# Patient Record
Sex: Female | Born: 1980 | Hispanic: Yes | Smoking: Never smoker
Health system: Southern US, Community
[De-identification: ages and names within clinical notes are randomized; demographics above are authoritative.]

## PROBLEM LIST (undated history)

## (undated) ENCOUNTER — Inpatient Hospital Stay (HOSPITAL_COMMUNITY): Payer: Self-pay

## (undated) DIAGNOSIS — R51 Headache: Secondary | ICD-10-CM

## (undated) DIAGNOSIS — L98499 Non-pressure chronic ulcer of skin of other sites with unspecified severity: Secondary | ICD-10-CM

## (undated) DIAGNOSIS — G709 Myoneural disorder, unspecified: Secondary | ICD-10-CM

## (undated) DIAGNOSIS — M545 Low back pain, unspecified: Secondary | ICD-10-CM

## (undated) DIAGNOSIS — G510 Bell's palsy: Secondary | ICD-10-CM

## (undated) HISTORY — DX: Non-pressure chronic ulcer of skin of other sites with unspecified severity: L98.499

## (undated) HISTORY — DX: Low back pain: M54.5

## (undated) HISTORY — DX: Bell's palsy: G51.0

## (undated) HISTORY — DX: Low back pain, unspecified: M54.50

## (undated) HISTORY — DX: Morbid (severe) obesity due to excess calories: E66.01

## (undated) HISTORY — DX: Myoneural disorder, unspecified: G70.9

---

## 2010-05-31 ENCOUNTER — Emergency Department (HOSPITAL_COMMUNITY)
Admission: EM | Admit: 2010-05-31 | Discharge: 2010-05-31 | Payer: Self-pay | Source: Home / Self Care | Admitting: Emergency Medicine

## 2010-09-08 LAB — POCT PREGNANCY, URINE: Preg Test, Ur: NEGATIVE

## 2010-09-08 LAB — URINALYSIS, ROUTINE W REFLEX MICROSCOPIC
Hgb urine dipstick: NEGATIVE
Nitrite: NEGATIVE
Protein, ur: NEGATIVE mg/dL

## 2010-09-08 LAB — URINE MICROSCOPIC-ADD ON

## 2012-10-25 ENCOUNTER — Ambulatory Visit (INDEPENDENT_AMBULATORY_CARE_PROVIDER_SITE_OTHER): Payer: Self-pay | Admitting: *Deleted

## 2012-10-25 ENCOUNTER — Encounter: Payer: Self-pay | Admitting: *Deleted

## 2012-10-25 VITALS — BP 98/67 | Ht <= 58 in | Wt 179.0 lb

## 2012-10-25 DIAGNOSIS — Z3481 Encounter for supervision of other normal pregnancy, first trimester: Secondary | ICD-10-CM

## 2012-10-25 DIAGNOSIS — Z3480 Encounter for supervision of other normal pregnancy, unspecified trimester: Secondary | ICD-10-CM

## 2012-10-25 NOTE — Progress Notes (Signed)
Patient is here for new ob today, blood drawn and interpreter present.  She is doing well and will follow up in the next two weeks to see the physician.  Her periods are usually normal and she is pretty sure her last cycle was February 28th.

## 2012-10-26 LAB — OBSTETRIC PANEL
Antibody Screen: NEGATIVE
HCT: 38 % (ref 36.0–46.0)
Hepatitis B Surface Ag: NEGATIVE
Lymphocytes Relative: 18 % (ref 12–46)
Lymphs Abs: 1.9 10*3/uL (ref 0.7–4.0)
MCHC: 34.2 g/dL (ref 30.0–36.0)
MCV: 84.4 fL (ref 78.0–100.0)
Monocytes Absolute: 0.8 10*3/uL (ref 0.1–1.0)
Neutro Abs: 8.1 10*3/uL — ABNORMAL HIGH (ref 1.7–7.7)
Platelets: 249 10*3/uL (ref 150–400)
RBC: 4.5 MIL/uL (ref 3.87–5.11)
RDW: 14.2 % (ref 11.5–15.5)
Rh Type: POSITIVE
WBC: 10.9 10*3/uL — ABNORMAL HIGH (ref 4.0–10.5)

## 2012-10-30 ENCOUNTER — Encounter: Payer: Self-pay | Admitting: Obstetrics & Gynecology

## 2012-10-30 ENCOUNTER — Ambulatory Visit (INDEPENDENT_AMBULATORY_CARE_PROVIDER_SITE_OTHER): Payer: Self-pay | Admitting: Obstetrics & Gynecology

## 2012-10-30 VITALS — BP 95/68 | Wt 178.0 lb

## 2012-10-30 DIAGNOSIS — Z3480 Encounter for supervision of other normal pregnancy, unspecified trimester: Secondary | ICD-10-CM | POA: Insufficient documentation

## 2012-10-30 DIAGNOSIS — Z113 Encounter for screening for infections with a predominantly sexual mode of transmission: Secondary | ICD-10-CM

## 2012-10-30 DIAGNOSIS — Z1151 Encounter for screening for human papillomavirus (HPV): Secondary | ICD-10-CM

## 2012-10-30 DIAGNOSIS — O34219 Maternal care for unspecified type scar from previous cesarean delivery: Secondary | ICD-10-CM

## 2012-10-30 DIAGNOSIS — Z124 Encounter for screening for malignant neoplasm of cervix: Secondary | ICD-10-CM

## 2012-10-30 NOTE — Progress Notes (Signed)
Having some nausea, no vomiting and is having headache.

## 2012-10-30 NOTE — Progress Notes (Signed)
   Subjective:    Ana Goodwin is a [id number] [redacted]w[redacted]d being seen today for her first obstetrical visit.  Her obstetrical history is significant for 2 prior cesarean sections. Patient does intend to breast feed. Pregnancy history fully reviewed.  Patient reports nausea.  Filed Vitals:   10/30/12 1327  BP: 95/68  Weight: 178 lb (80.74 kg)    HISTORY: OB History   Grav Para Term Preterm Abortions TAB SAB Ect Mult Living   3 2 2  0 0 0 0 0 0 2     # Outc Date GA Lbr Len/2nd Wgt Sex Del Anes PTL Lv   1 TRM 2000 [redacted]w[redacted]d  6lb11oz(3.033kg) M CCS  No Yes   Comments: failure to progress  Narrow pelvis   2 TRM 2008 [redacted]w[redacted]d   M LTCS  No Yes   3 CUR              Past Medical History  Diagnosis Date  . 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 Pain in lower back     2 yrs ago/seen at hospital/lasted one months to be able to stand alone and walk  . Neuromuscular disorder    History reviewed. No pertinent past surgical history. Family History  Problem Relation Age of Onset  . Diabetes Mother   . Cancer Father     unsure of type     Exam    Uterus:     Pelvic Exam:    Perineum: No Hemorrhoids   Vulva: normal   Vagina:  normal mucosa   pH:    Cervix: anteverted   Adnexa: normal adnexa   Bony Pelvis: android  System: Breast:  normal appearance, no masses or tenderness   Skin: normal coloration and turgor, no rashes    Neurologic: oriented   Extremities: normal strength, tone, and muscle mass   HEENT PERRLA   Mouth/Teeth mucous membranes moist, pharynx normal without lesions   Neck supple   Cardiovascular: regular rate and rhythm   Respiratory:  appears well, vitals normal, no respiratory distress, acyanotic, normal RR, ear and throat exam is normal, neck free of mass or lymphadenopathy, chest clear, no wheezing, crepitations, rhonchi, normal symmetric air entry   Abdomen: soft, non-tender; bowel sounds normal; no masses,  no organomegaly   Urinary: urethral  meatus normal      Assessment:    Pregnancy: [id number] Patient Active Problem List   Diagnosis Date Noted  . Supervision of other normal pregnancy 10/30/2012  . Previous cesarean delivery, antepartum condition or complication 10/30/2012        Plan:     Initial labs drawn. Prenatal vitamins. Problem list reviewed and updated. Genetic Screening discussed First Screen: declined.  Ultrasound discussed; fetal survey: requested.  Follow up in 4 weeks.    Ana Griggs C. 10/30/2012

## 2012-11-28 ENCOUNTER — Ambulatory Visit (INDEPENDENT_AMBULATORY_CARE_PROVIDER_SITE_OTHER): Payer: Self-pay | Admitting: Family Medicine

## 2012-11-28 ENCOUNTER — Encounter: Payer: Self-pay | Admitting: Family Medicine

## 2012-11-28 VITALS — BP 103/67 | Wt 178.0 lb

## 2012-11-28 DIAGNOSIS — Z3480 Encounter for supervision of other normal pregnancy, unspecified trimester: Secondary | ICD-10-CM

## 2012-11-28 DIAGNOSIS — O34219 Maternal care for unspecified type scar from previous cesarean delivery: Secondary | ICD-10-CM

## 2012-11-28 DIAGNOSIS — Z3482 Encounter for supervision of other normal pregnancy, second trimester: Secondary | ICD-10-CM

## 2012-11-28 NOTE — Patient Instructions (Signed)
Embarazo - Segundo trimestre (Pregnancy - Second Trimester) El segundo trimestre del embarazo (del 3 al 6 mes) es un perodo de evolucin rpida para usted y el beb. Hacia el final del sexto mes, el beb mide aproximadamente 23 cm y pesa 680 g. Comenzar a sentir los movimientos del beb entre las 18 y las 20 semanas de embarazo. Podr sentir las pataditas ("quickening en ingls"). Hay un rpido aumento de peso. Puede segregar un lquido claro (calostro) de las mamas. Quizs sienta pequeas contracciones en el vientre (tero) Esto se conoce como falso trabajo de parto o contracciones de Braxton-Hicks. Es como una prctica del trabajo de parto que se produce cuando el beb est listo para salir. Generalmente los problemas de vmitos matinales ya se han superado hacia el final del primer trimestre. Algunas mujeres desarrollan pequeas manchas oscuras (que se denominan cloasma, mscara del embarazo) en la cara que normalmente se van luego del nacimiento del beb. La exposicin al sol empeora las manchas. Puede desarrollarse acn en algunas mujeres embarazadas, y puede desaparecer en aquellas que ya tienen acn. EXAMENES PRENATALES  Durante los exmenes prenatales, deber seguir realizando pruebas de sangre, segn avance el embarazo. Estas pruebas se realizan para controlar su salud y la del beb. Tambin se realizan anlisis de sangre para conocer los niveles de hemoglobina. La anemia (bajo nivel de hemoglobina) es frecuente durante el embarazo. Para prevenirla, se administran hierro y vitaminas. Tambin se le realizarn exmenes para saber si tiene diabetes entre las 24 y las 28 semanas del embarazo. Podrn repetirle algunas de las pruebas que le hicieron previamente.  En cada visita le medirn el tamao del tero. Esto se realiza para asegurarse de que el beb est creciendo correctamente de acuerdo al estado del embarazo.  Tambin en cada visita prenatal controlarn su presin arterial. Esto se realiza  para asegurarse de que no tenga toxemia.  Se controlar su orina para asegurarse de que no tenga infecciones, diabetes o protena en la orina.  Se controlar su peso regularmente para asegurarse que el aumento ocurre al ritmo indicado. Esto se hace para asegurarse que usted y el beb tienen una evolucin normal.  En algunas ocasiones se realiza una prueba de ultrasonido para confirmar el correcto desarrollo y evolucin del beb. Esta prueba se realiza con ondas sonoras inofensivas para el beb, de modo que el profesional pueda calcular ms precisamente la fecha del parto. Algunas veces se realizan pruebas especializadas del lquido amnitico que rodea al beb. Esta prueba se denomina amniocentesis. El lquido amnitico se obtiene introduciendo una aguja en el vientre (abdomen). Se realiza para controlar los cromosomas en aquellos casos en los que existe alguna preocupacin acerca de algn problema gentico que pueda sufrir el beb. En ocasiones se lleva a cabo cerca del final del embarazo, si es necesario inducir al parto. En este caso se realiza para asegurarse que los pulmones del beb estn lo suficientemente maduros como para que pueda vivir fuera del tero. CAMBIOS QUE OCURREN EN EL SEGUNDO TRIMESTRE DEL EMBARAZO Su organismo atravesar numerosos cambios durante el embarazo. Estos pueden variar de una persona a otra. Converse con el profesional que la asiste acerca los cambios que usted note y que la preocupen.  Durante el segundo trimestre probablemente sienta un aumento del apetito. Es normal tener "antojos" de ciertas comidas. Esto vara de una persona a otra y de un embarazo a otro.  El abdomen inferior comenzar a abultarse.  Podr tener la necesidad de orinar con ms frecuencia debido a   que el tero y el beb presionan sobre la vejiga. Tambin es frecuente contraer ms infecciones urinarias durante el embarazo. Puede evitarlas bebiendo gran cantidad de lquidos y vaciando la vejiga antes y  despus de mantener relaciones sexuales.  Podrn aparecer las primeras estras en las caderas, abdomen y mamas. Estos son cambios normales del cuerpo durante el embarazo. No existen medicamentos ni ejercicios que puedan prevenir estos cambios.  Es posible que comience a desarrollar venas inflamadas y abultadas (varices) en las piernas. El uso de medias de descanso, elevar sus pies durante 15 minutos, 3 a 4 veces al da y limitar la sal en su dieta ayuda a aliviar el problema.  Podr sentir acidez gstrica a medida que el tero crece y presiona contra el estmago. Puede tomar anticidos, con la autorizacin de su mdico, para aliviar este problema. Tambin es til ingerir pequeas comidas 4 a 5 veces al da.  La constipacin puede tratarse con un laxante o agregando fibra a su dieta. Beber grandes cantidades de lquidos, comer vegetales, frutas y granos integrales es de gran ayuda.  Tambin es beneficioso practicar actividad fsica. Si ha sido una persona activa hasta el embarazo, podr continuar con la mayora de las actividades durante el mismo. Si ha sido menos activa, puede ser beneficioso que comience con un programa de ejercicios, como realizar caminatas.  Puede desarrollar hemorroides hacia el final del segundo trimestre. Tomar baos de asiento tibios y utilizar cremas recomendadas por el profesional que lo asiste sern de ayuda para los problemas de hemorroides.  Tambin podr sentir dolor de espalda durante este momento de su embarazo. Evite levantar objetos pesados, utilice zapatos de taco bajo y mantenga una buena postura para ayudar a reducir los problemas de espalda.  Algunas mujeres embarazadas desarrollan hormigueo y adormecimiento de la mano y los dedos debido a la hinchazn y compresin de los ligamentos de la mueca (sndrome del tnel carpiano). Esto desaparece una vez que el beb nace.  Como sus pechos se agrandan, necesitar un sujetador ms grande. Use un sostn de soporte,  cmodo y de algodn. No utilice un sostn para amamantar hasta el ltimo mes de embarazo si va a amamantar al beb.  Podr observar una lnea oscura desde el ombligo hacia la zona pbica denominada linea nigra.  Podr observar que sus mejillas se ponen coloradas debido al aumento de flujo sanguneo en la cara.  Podr desarrollar "araitas" en la cara, cuello y pecho. Esto desaparece una vez que el beb nace. INSTRUCCIONES PARA EL CUIDADO DOMICILIARIO  Es extremadamente importante que evite el cigarrillo, hierbas medicinales, alcohol y las drogas no prescriptas durante el embarazo. Estas sustancias qumicas afectan la formacin y el desarrollo del beb. Evite estas sustancias durante todo el embarazo para asegurar el nacimiento de un beb sano.  La mayor parte de los cuidados que se aconsejan son los mismos que los indicados para el primer trimestre del embarazo. Cumpla con las citas tal como se le indic. Siga las instrucciones del profesional que lo asiste con respecto al uso de los medicamentos, el ejercicio y la dieta.  Durante el embarazo debe obtener nutrientes para usted y para su beb. Consuma alimentos balanceados a intervalos regulares. Elija alimentos como carne, pescado, leche y otros productos lcteos descremados, vegetales, frutas, panes integrales y cereales. El profesional le informar cul es el aumento de peso ideal.  Las relaciones sexuales fsicas pueden continuarse hasta cerca del fin del embarazo si no existen otros problemas. Estos problemas pueden ser la prdida temprana (  prematura) de lquido amnitico de las membranas, sangrado vaginal, dolor abdominal u otros problemas mdicos o del embarazo.  Realice actividad fsica todos los das, si no tiene restricciones. Consulte con el profesional que la asiste si no sabe con certeza si determinados ejercicios son seguros. El mayor aumento de peso tiene lugar durante los ltimos 2 trimestres del embarazo. El ejercicio la ayudar  a:  Controlar su peso.  Ponerla en forma para el parto.  Ayudarla a perder peso luego de haber dado a luz.  Use un buen sostn o como los que se usan para hacer deportes para aliviar la sensibilidad de las mamas. Tambin puede serle til si lo usa mientras duerme. Si pierde calostro, podr utilizar apsitos en el sostn.  No utilice la baera con agua caliente, baos turcos y saunas durante el embarazo.  Utilice el cinturn de seguridad sin excepcin cuando conduzca. Este la proteger a usted y al beb en caso de accidente.  Evite comer carne cruda, queso crudo, y el contacto con los utensilios y desperdicios de los gatos. Estos elementos contienen grmenes que pueden causar defectos de nacimiento en el beb.  El segundo trimestre es un buen momento para visitar a su dentista y evaluar su salud dental si an no lo ha hecho. Es importante mantener los dientes limpios. Utilice un cepillo de dientes blando. Cepllese ms suavemente durante el embarazo.  Es ms fcil perder algo de orina durante el embarazo. Apretar y fortalecer los msculos de la pelvis la ayudar con este problema. Practique detener la miccin cuando est en el bao. Estos son los mismos msculos que necesita fortalecer. Son tambin los mismos msculos que utiliza cuando trata de evitar los gases. Puede practicar apretando estos msculos 10 veces, y repetir esto 3 veces por da aproximadamente. Una vez que conozca qu msculos debe apretar, no realice estos ejercicios durante la miccin. Puede favorecerle una infeccin si la orina vuelve hacia atrs.  Pida ayuda si tiene necesidades econmicas, de asesoramiento o nutricionales durante el embarazo. El profesional podr ayudarla con respecto a estas necesidades, o derivarla a otros especialistas.  La piel puede ponerse grasa. Si esto sucede, lvese la cara con un jabn suave, utilice un humectante no graso y maquillaje con base de aceite o crema. CONSUMO DE MEDICAMENTOS Y DROGAS  DURANTE EL EMBARAZO  Contine tomando las vitaminas apropiadas para esta etapa tal como se le indic. Las vitaminas deben contener un miligramo de cido flico y deben suplementarse con hierro. Guarde todas las vitaminas fuera del alcance de los nios. La ingestin de slo un par de vitaminas o tabletas que contengan hierro puede ocasionar la muerte en un beb o en un nio pequeo.  Evite el uso de medicamentos, inclusive los de venta libre y hierbas que no hayan sido prescriptos o indicados por el profesional que la asiste. Algunos medicamentos pueden causar problemas fsicos al beb. Utilice los medicamentos de venta libre o de prescripcin para el dolor, el malestar o la fiebre, segn se lo indique el profesional que lo asiste. No utilice aspirina.  El consumo de alcohol est relacionado con ciertos defectos de nacimiento. Esto incluye el sndrome de alcoholismo fetal. Debe evitar el consumo de alcohol en cualquiera de sus formas. El cigarrillo causa nacimientos prematuros y bebs de bajo peso. El uso de drogas recreativas est absolutamente prohibido. Son muy nocivas para el beb. Un beb que nace de una madre adicta, ser adicto al nacer. Ese beb tendr los mismos sntomas de abstinencia que un adulto.    Infrmele al profesional si consume alguna droga.  No consuma drogas ilegales. Pueden causarle mucho dao al beb. SOLICITE ATENCIN MDICA SI: Tiene preguntas o preocupaciones durante su embarazo. Es mejor que llame para consultar las dudas que esperar hasta su prxima visita prenatal. De esta forma se sentir ms tranquila.  SOLICITE ATENCIN MDICA DE INMEDIATO SI:  La temperatura oral se eleva sin motivo por encima de 102 F (38.9 C) o segn le indique el profesional que lo asiste.  Tiene una prdida de lquido por la vagina (canal de parto). Si sospecha una ruptura de las membranas, tmese la temperatura y llame al profesional para informarlo sobre esto.  Observa unas pequeas manchas,  una hemorragia vaginal o elimina cogulos. Notifique al profesional acerca de la cantidad y de cuntos apsitos est utilizando. Unas pequeas manchas de sangre son algo comn durante el embarazo, especialmente despus de mantener relaciones sexuales.  Presenta un olor desagradable en la secrecin vaginal y observa un cambio en el color, de transparente a blanco.  Contina con las nuseas y no obtiene alivio de los remedios indicados. Vomita sangre o algo similar a la borra del caf.  [place] o sube ms de 900 g. en una semana, o segn lo indicado por el profesional que la asiste.  Observa que se le hinchan el rostro, las manos, los pies o las piernas.  Ha estado expuesta a la rubola y no ha sufrido la enfermedad.  Ha estado expuesta a la quinta enfermedad o a la varicela.  Presenta dolor abdominal. Las molestias en el ligamento redondo son una causa no cancerosa (benigna) frecuente de dolor abdominal durante el embarazo. El profesional que la asiste deber evaluarla.  Presenta dolor de cabeza intenso que no se alivia.  Presenta fiebre, diarrea, dolor al orinar o le falta la respiracin.  Presenta dificultad para ver, visin borrosa, o visin doble.  Sufre una cada, un accidente de trnsito o cualquier tipo de trauma.  Vive en un hogar en el que existe violencia fsica o mental. Document Released: 03/24/2005 Document Revised: 03/08/2012 [employer] Patient Information 2014 [employer].  Lactancia materna  (Breastfeeding)  El cambio hormonal durante el embarazo produce el desarrollo del tejido mamario y un aumento en el nmero y tamao de los conductos galactforos. La hormona prolactina permite que las protenas, los azcares y las grasas de la sangre produzcan la leche materna en las glndulas productoras de leche. La hormona progesterona impide que la leche materna sea liberada antes del nacimiento del beb. Despus del nacimiento del beb, su nivel de progesterona disminuye  permitiendo que la leche materna sea liberada. Pensar en el beb, as como la succin o el llanto, pueden estimular la liberacin de leche de las glndulas productoras de leche.  La decisin de amamantar (lactar) es una de las mejores opciones que usted puede hacer para usted y su beb. La informacin que sigue da una breve resea de los beneficios, as como otras caractersticas importantes que debe saber sobre la lactancia materna.  LOS BENEFICIOS DE AMAMANTAR  Para el beb   La primera leche (calostro) ayuda al mejor funcionamiento del sistema digestivo del beb.   La leche tiene anticuerpos que provienen de la madre y que ayudan a prevenir las infecciones en el beb.   El beb tiene una menor incidencia de asma, alergias y del sndrome de muerte sbita del lactante (SMSL).   Los nutrientes de la leche materna son mejores para el beb que la leche maternizada.  La leche materna mejora   el desarrollo cerebral del beb.   Su beb tendr menos gases, clicos y estreimiento.  Es menos probable que el beb desarrolle otras enfermedades, como obesidad infantil, asma o diabetes mellitus. Para usted   La lactancia materna favorece el desarrollo de un vnculo muy especial entre la madre y el beb.   Es ms conveniente, siempre disponible, a la temperatura adecuada y econmica.   La lactancia materna ayuda a quemar caloras y a perder el peso ganado durante el embarazo.   Hace que el tero se contraiga ms rpidamente a su tamao normal y disminuye el sangrado despus del parto.   Las madres que amamantan tienen menos riesgo de desarrollar osteoporosis o cncer de mama o de ovario en el futuro.  FRECUENCIA DEL AMAMANTAMIENTO   Un beb sano, nacido a trmino, puede amamantarse con tanta frecuencia como cada hora, o espaciar las comidas cada tres horas. La frecuencia en la lactancia varan de un beb a otro.   Los recin nacidos deben ser alimentados por lo menos cada 2-3 horas  durante el da y cada 4-5 horas durante la noche. Usted debe amamantarlo un mnimo de 8 tomas en un perodo de 24 horas.  Despierte al beb para amamantarlo si han pasado 3-4 horas desde la ltima comida.  Amamante cuando sienta la necesidad de reducir la plenitud de sus senos o cuando el beb muestre signos de hambre. Las seales de que el beb puede tener hambre son:  Aumenta su estado de alerta o vigilancia.  Se estira.  Mueve la cabeza de un lado a otro.  Mueve la cabeza y abre la boca cuando se le toca la mejilla o la boca (reflejo de succin).  Aumenta las vocalizaciones, tales como sonidos de succin, relamerse los labios, arrullos, suspiros, o chirridos.  Mueve la mano hacia la boca.  Se chupa con ganas los dedos o las manos.  Agitacin.  Llanto intermitente.  Los signos de hambre extrema requerirn que lo calme y lo consuele antes de tratar de alimentarlo. Los signos de hambre extrema son:  Agitacin.  Llanto fuerte e intenso.  Gritos.  El amamantamiento frecuente la ayudar a producir ms leche y a prevenir problemas de dolor en los pezones e hinchazn de las mamas.  LACTANCIA MATERNA   Ya sea que se encuentre acostada o sentada, asegrese que el abdomen del beb est enfrente el suyo.   Sostenga la mama con el pulgar por arriba y los otros 4 dedos por debajo del pezn. Asegrese que sus dedos se encuentren lejos del pezn y de la boca del beb.   Empuje suavemente los labios del beb con el pezn o con el dedo.   Cuando la boca del beb se abra lo suficiente, introduzca el pezn y la zona oscura que lo rodea (areola) tanto como le sea posible dentro de la boca.  Debe haber ms areola visible por arriba del labio superior que por debajo del labio inferior.  La lengua del beb debe estar entre la enca inferior y el seno.  Asegrese de que la boca del beb est en la posicin correcta alrededor del pezn (prendida). Los labios del beb deben crear un sello  sobre su pecho.  Las seales de que el beb se ha prendido eficazmente al pezn son:  Tironea o succiona sin dolor.  Se escucha que traga entre las succiones.  No hace ruidos ni chasquidos.  Hay movimientos musculares por arriba y por delante de sus odos al succionar.  El beb debe   succionar unos 2-3 minutos para que salga la leche. Permita que el nio se alimente en cada mama todo lo que desee. Alimente al beb hasta que se desprenda o se quede dormido en el primer pecho y luego ofrzcale el segundo pecho.  Las seales de que el beb est lleno y satisfecho son:  Disminuye gradualmente el nmero de succiones o no succiona.  Se queda dormido.  Extiende o relaja su cuerpo.  Retiene una pequea cantidad de leche en la boca.  Se desprende del pecho por s mismo.  Los signos de una lactancia materna eficaz son:  Los senos han aumentado la firmeza, el peso y el tamao antes de la alimentacin.  Son ms blandos despus de amamantar.  Un aumento del volumen de leche, y tambin el cambio de su consistencia y color se producen hacia el quinto da de lactancia materna.  La congestin mamaria se alivia al dar de mamar.  Los pezones no duelen, ni estn agrietados ni sangran.  De ser necesario, interrumpa la succin poniendo su dedo en la esquina de la boca del beb y deslizando el dedo entre sus encas. A continuacin, retire la mama de su boca.  Es comn que los bebs regurgiten un poco despus de comer.  A menudo los bebs tragan aire al alimentarse. Esto puede hacer que se sienta molesto. Hacer eructar al beb al cambiar de pecho puede ser de ayuda.  Se recomiendan suplementos de vitamina D para los bebs que reciben slo leche materna.  Evite el uso del chupete durante las primeras 4 a 6 semanas de vida.  Evite la alimentacin suplementaria con agua, frmula o jugo en lugar de la leche materna. La leche materna es todo el alimento que el beb necesita. No es necesario que el  nio ingiera agua o preparados de bibern. Sus pechos producirn ms leche si se evita la alimentacin suplementaria durante las primeras semanas. COMO SABER SI EL BEB OBTIENE LA SUFICIENTE LECHE MATERNA  Preguntarse si el beb obtiene la cantidad suficiente de leche es una preocupacin frecuente entre las madres. Puede asegurarse que el beb tiene la leche suficiente si:   El beb succiona activamente y usted escucha que traga.   El beb parece estar relajado y satisfecho despus de mamar.   El nio se alimenta al menos 8 a 12 veces en 24 horas.  Durante los primeros 3 a 5 das de vida:  Moja 3-5 paales en 24 horas. La materia fecal debe ser blanda y amarillenta.  Tiene al menos 3 a 4 deposiciones en 24 horas. La materia fecal debe ser blanda y amarillenta.  A los 5-7 das de vida, el beb debe tener al menos 3-6 deposiciones en 24 horas. La materia fecal debe ser grumosa y amarilla a los 5 das de vida.  Su beb tiene una prdida de peso menor a 7al 10% durante los primeros 3 das de vida.  El beb no pierde peso despus de 3-7 das de vida.  El beb debe aumentar 4 a 6 libras (120 a 170 gr.) por semana despus de los 4 das de vida.  Aumenta de peso a los 5 das de vida y vuelve al peso del nacimiento dentro de las 2 semanas. CONGESTIN MAMARIA  Durante la primera semana despus del parto, usted puede experimentar hinchazn en las mamas (congestin mamaria). Al estar congestionadas, las mamas se sienten pesadas, calientes o sensibles al tacto. El pico de la congestin ocurre a las 24 -48 horas despus del parto.     La congestin puede disminuirse:  Continuando con la lactancia materna.  Aumentando la frecuencia.  Tomando duchas calientes o aplicando calor hmedo en los senos antes de cada comida. Esto aumenta la circulacin y ayuda a que la leche fluya.   Masajeando suavemente el pecho antes y durante las comidas. Con las yemas de los dedos, masajee la pared del pecho hacia  el pezn en un movimiento circular.   Asegurarse de que el beb vaca al menos uno de sus pechos en cada alimentacin. Tambin ayuda si comienza la siguiente toma en el otro seno.   Extraiga manualmente o con un sacaleches las mamas para vaciar los pechos si el beb tiene sueo o no se aliment bien. Tambin puede extraer la leche cuando vuelva a trabajar o si siente que se estn congestionando las mamas.  Asegrese de que el beb se prende y est bien colocado durante la lactancia. Si sigue estas indicaciones, la congestin debe mejorar en 24 a 48 horas. Si an tiene dificultades, consulte a su asesor en lactancia.  CUDESE USTED MISMA  Cuide sus mamas.   Bese o dchese diariamente.   Evite usar jabn en los pezones.   Use un sostn de soporte Evite el uso de sostenes con aro.  Seque al aire sus pezones durante 3-4 minutos despus de cada comida.   Utilice slo apsitos de algodn en el sostn para absorber las prdidas de leche. La prdida de un poco de leche materna entre las comidas es normal.   Use solamente lanolina pura en sus pezones despus de amamantar. Usted no tiene que lavarla antes de alimentar al beb. Otra opcin es sacarse unas gotas de leche y masajear suavemente los pezones.  Continuar con los autocontroles de la mama. Cudese.   Consuma alimentos saludables. Alterne 3 comidas con 3 colaciones.  Evite los alimentos que usted nota que perjudican al beb.  Beba leche, jugos de fruta y agua para satisfacer su sed (aproximadamente 8 vasos al da).   Descanse con frecuencia, reljese y tome sus vitaminas prenatales para evitar la fatiga, el estrs y la anemia.  Evite masticar y fumar tabaco.  Evite el consumo de alcohol y drogas.  Tome medicamentos de venta libre y recetados tal como le indic su mdico o farmacutico. Siempre debe consultar con su mdico o farmacutico antes de tomar cualquier medicamento, vitamina o suplemento de hierbas.  Sepa que  durante la lactancia puede quedar embarazada. Si lo desea, hable con su mdico acerca de la planificacin familiar y los mtodos anticonceptivos seguros que puede utilizar durante la lactancia. SOLICITE ATENCIN MDICA SI:   Usted siente que quiere dejar de amamantar o se siente frustrada con la lactancia.  Siente dolor en los senos o en los pezones.  Sus pezones estn agrietados o sangran.  Sus pechos estn irritados, sensibles o calientes.  Tiene un rea hinchada en cualquiera de los senos.  Siente escalofros o fiebre.  Tiene nuseas o vmitos.  Observa un drenaje en los pezones.  Sus mamas no se llenan antes de amamantarlo al 5to da despus del parto.  Se siente triste y deprimida.  El nio est demasiado somnoliento como para comer.  El nio tiene problemas para respirar.   Moja menos de 3 paales en 24 horas.  Mueve el intestino menos de 3 veces en 24 horas.  La piel del beb o la parte blanca de sus ojos est ms amarilla.   El beb no ha aumentado de peso a los 5 das de vida.   ASEGRESE DE QUE:   Comprende estas instrucciones.  Controlar su enfermedad.  Solicitar ayuda de inmediato si no mejora o si empeora. Document Released: 06/14/2005 Document Revised: 03/08/2012 [employer] Patient Information 2014 [employer].  

## 2012-11-28 NOTE — Progress Notes (Signed)
LUQ pain, likely musculoskeletal. Will need anatomy ordered at next visit.

## 2012-11-28 NOTE — Progress Notes (Signed)
Has pelvic pain that is constant, rates the pain as a level 5.

## 2012-12-27 ENCOUNTER — Ambulatory Visit (INDEPENDENT_AMBULATORY_CARE_PROVIDER_SITE_OTHER): Payer: Self-pay | Admitting: Obstetrics & Gynecology

## 2012-12-27 VITALS — BP 115/74 | Wt 175.0 lb

## 2012-12-27 DIAGNOSIS — Z3480 Encounter for supervision of other normal pregnancy, unspecified trimester: Secondary | ICD-10-CM

## 2012-12-27 DIAGNOSIS — Z3482 Encounter for supervision of other normal pregnancy, second trimester: Secondary | ICD-10-CM

## 2012-12-27 DIAGNOSIS — O34219 Maternal care for unspecified type scar from previous cesarean delivery: Secondary | ICD-10-CM

## 2012-12-27 NOTE — Progress Notes (Signed)
Patient is Spanish-speaking only, Spanish interpreter present for this encounter.  Anatomy scan at Dr. Elsie Stain office on 01/05/13, will await results.  No other complaints or concerns.  Routine obstetric precautions reviewed.

## 2012-12-27 NOTE — Patient Instructions (Signed)
Regrese a la clinica cuando tenga su cita. Si tiene problemas o preguntas, llama a la clinica o vaya a la sala [facility] emerg[facility] al [facility].    

## 2012-12-27 NOTE — Progress Notes (Signed)
P-[id number] 

## 2013-01-02 ENCOUNTER — Encounter: Payer: Self-pay | Admitting: Obstetrics & Gynecology

## 2013-01-02 ENCOUNTER — Inpatient Hospital Stay (HOSPITAL_COMMUNITY)
Admission: AD | Admit: 2013-01-02 | Discharge: 2013-01-02 | Disposition: A | Discharge status: Home / Self Care | Payer: Self-pay | Source: Ambulatory Visit | Attending: Obstetrics & Gynecology | Admitting: Obstetrics & Gynecology

## 2013-01-02 ENCOUNTER — Encounter (HOSPITAL_COMMUNITY): Payer: Self-pay | Admitting: *Deleted

## 2013-01-02 ENCOUNTER — Ambulatory Visit (INDEPENDENT_AMBULATORY_CARE_PROVIDER_SITE_OTHER): Payer: Self-pay | Admitting: Obstetrics & Gynecology

## 2013-01-02 VITALS — BP 116/77 | Wt 177.0 lb

## 2013-01-02 DIAGNOSIS — O9989 Other specified diseases and conditions complicating pregnancy, childbirth and the puerperium: Secondary | ICD-10-CM

## 2013-01-02 DIAGNOSIS — O99891 Other specified diseases and conditions complicating pregnancy: Secondary | ICD-10-CM | POA: Insufficient documentation

## 2013-01-02 DIAGNOSIS — F411 Generalized anxiety disorder: Secondary | ICD-10-CM | POA: Insufficient documentation

## 2013-01-02 DIAGNOSIS — Z3480 Encounter for supervision of other normal pregnancy, unspecified trimester: Secondary | ICD-10-CM

## 2013-01-02 DIAGNOSIS — G43909 Migraine, unspecified, not intractable, without status migrainosus: Secondary | ICD-10-CM

## 2013-01-02 DIAGNOSIS — R51 Headache: Secondary | ICD-10-CM | POA: Insufficient documentation

## 2013-01-02 DIAGNOSIS — O99340 Other mental disorders complicating pregnancy, unspecified trimester: Secondary | ICD-10-CM | POA: Insufficient documentation

## 2013-01-02 DIAGNOSIS — O26892 Other specified pregnancy related conditions, second trimester: Secondary | ICD-10-CM

## 2013-01-02 DIAGNOSIS — R109 Unspecified abdominal pain: Secondary | ICD-10-CM | POA: Insufficient documentation

## 2013-01-02 HISTORY — DX: Headache: R51

## 2013-01-02 LAB — URINALYSIS, ROUTINE W REFLEX MICROSCOPIC
Bilirubin Urine: NEGATIVE
Glucose, UA: NEGATIVE mg/dL
Ketones, ur: NEGATIVE mg/dL
Leukocytes, UA: NEGATIVE
Nitrite: NEGATIVE
Protein, ur: NEGATIVE mg/dL
Urobilinogen, UA: 0.2 mg/dL (ref 0.0–1.0)

## 2013-01-02 LAB — CBC
MCH: 29.1 pg (ref 26.0–34.0)
MCHC: 34.8 g/dL (ref 30.0–36.0)
MCV: 83.7 fL (ref 78.0–100.0)
RBC: 4.16 MIL/uL (ref 3.87–5.11)

## 2013-01-02 LAB — COMPREHENSIVE METABOLIC PANEL
ALT: 10 U/L (ref 0–35)
Alkaline Phosphatase: 64 U/L (ref 39–117)
CO2: 23 mEq/L (ref 19–32)
Chloride: 105 mEq/L (ref 96–112)
GFR calc Af Amer: >90 mL/min (ref >90)
GFR calc non Af Amer: >90 mL/min (ref >90)
Glucose, Bld: 96 mg/dL (ref 70–99)
Potassium: 3.4 mEq/L — ABNORMAL LOW (ref 3.5–5.1)
Sodium: 138 mEq/L (ref 135–145)
Total Bilirubin: 0.4 mg/dL (ref 0.3–1.2)
Total Protein: 6.3 g/dL (ref 6.0–8.3)

## 2013-01-02 MED ORDER — DEXAMETHASONE SODIUM PHOSPHATE 10 MG/ML IJ SOLN
10.0000 mg | INTRAMUSCULAR | Status: AC
  Administered 2013-01-02: 10 mg via INTRAVENOUS
  Filled 2013-01-02: qty 1

## 2013-01-02 MED ORDER — BUTALBITAL-APAP-CAFFEINE 50-325-40 MG PO TABS: 1.0000 | ORAL_TABLET | Freq: Four times a day (QID) | ORAL | Status: DC | PRN

## 2013-01-02 MED ORDER — BUTALBITAL-APAP-CAFFEINE 50-325-40 MG PO TABS
1.0000 | ORAL_TABLET | ORAL | Status: DC
  Filled 2013-01-02: qty 1

## 2013-01-02 MED ORDER — DIPHENHYDRAMINE HCL 50 MG/ML IJ SOLN
25.0000 mg | INTRAMUSCULAR | Status: AC
  Administered 2013-01-02: 25 mg via INTRAVENOUS
  Filled 2013-01-02: qty 1

## 2013-01-02 MED ORDER — METOCLOPRAMIDE HCL 5 MG/ML IJ SOLN
10.0000 mg | INTRAMUSCULAR | Status: AC
  Administered 2013-01-02: 10 mg via INTRAVENOUS
  Filled 2013-01-02: qty 2

## 2013-01-02 MED ORDER — SODIUM CHLORIDE 0.9 % IV BOLUS (SEPSIS)
1000.0000 mL | Freq: Once | INTRAVENOUS | Status: AC
  Administered 2013-01-02: 1000 mL via INTRAVENOUS

## 2013-01-02 NOTE — Progress Notes (Signed)
P=88 

## 2013-01-02 NOTE — Progress Notes (Signed)
Patient states she feels better and pain has subsided.

## 2013-01-02 NOTE — Patient Instructions (Signed)
Regrese a la clinica cuando tenga su cita. Si tiene problemas o preguntas, llama a la clinica o vaya a la sala [facility] emerg[facility] al [facility].    

## 2013-01-02 NOTE — Progress Notes (Addendum)
Patient states she feels shaky, shows signs of anxiety. States she feels scared. C/O abdominal pain. Clayton Lefort, CNM at bedside. Patient  reasurred.

## 2013-01-02 NOTE — Progress Notes (Signed)
Patient seen as a walk-in today for severe headache x 2 days.  Patient is Spanish-speaking only, Spanish interpreter present for this encounter.  Associated with nausea and vomiting, and neck tenderness, no nuchal rigidity.  Also has photophobia. No visual symptoms, epigastric or RUQ pain.  Not relieved by Tylenol. Rates it a 10 on a pain scale. BP normal.   Patient was sent to [facility] MAU for evaluation; likely has severe migraine but will need further evaluation if headache is not controlled with analgesia.  No obstetric complaints or concerns.  Routine obstetric precautions reviewed.

## 2013-01-02 NOTE — MAU Provider Note (Signed)
Chief Complaint: Headache   First Provider Initiated Contact with Patient 01/02/13 1743     SUBJECTIVE HPI: Ana Goodwin is a 32 y.o. G3P2002 at [redacted]w[redacted]d by LMP who presents to maternity admissions sent from [facility] office with headache x2 days, described as worst headache pt has ever had starting yesterday.  She has light sensitivity but denies n/v with this.  She denies LOF, vaginal bleeding, vaginal itching/burning, urinary symptoms, dizziness, n/v, or fever/chills.   Past Medical History  Diagnosis Date  . 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 Pain in lower back     2 yrs ago/seen at hospital/lasted one months to be able to stand alone and walk  . Neuromuscular disorder   . Ulcer of abdomen wall   . NWGNFAOZ(308.6)    Past Surgical History  Procedure Laterality Date  . Cesarean section      X 2   History   Social History  . Marital Status: Married    Spouse Name: N/A    Number of Children: N/A  . Years of Education: N/A   Occupational History  . Not on file.   Social History Main Topics  . Smoking status: Never Smoker   . Smokeless tobacco: Never Used  . Alcohol Use: No  . Drug Use: No  . Sexually Active: Yes   Other Topics Concern  . Not on file   Social History Narrative  . No narrative on file   No current facility-administered medications on file prior to encounter.   No current outpatient prescriptions on file prior to encounter.   No Known Allergies  ROS: Pertinent items in HPI  OBJECTIVE Blood pressure 110/56, pulse 87, temperature 98.1 F (36.7 C), temperature source Oral, resp. rate 18, height 4\' 9"  (1.448 m), weight 81.012 kg (178 lb 9.6 oz), last menstrual period 08/25/2012. GENERAL: Well-developed, well-nourished female in no acute distress.  HEENT: Normocephalic HEART: normal rate RESP: normal effort ABDOMEN: Soft, non-tender EXTREMITIES: Nontender, no edema NEURO: Alert and oriented   FHT 145 by  doppler  LAB RESULTS Results for orders placed during the hospital encounter of 01/02/13 (from the past 48 hour(s))  URINALYSIS, ROUTINE W REFLEX MICROSCOPIC     Status: Abnormal   Collection Time    01/02/13  6:30 PM      Result Value Range   Color, Urine YELLOW  YELLOW   APPearance HAZY (*) CLEAR   Specific Gravity, Urine 1.010  1.005 - 1.030   pH 6.5  5.0 - 8.0   Glucose, UA NEGATIVE  NEGATIVE mg/dL   Hgb urine dipstick NEGATIVE  NEGATIVE   Bilirubin Urine NEGATIVE  NEGATIVE   Ketones, ur NEGATIVE  NEGATIVE mg/dL   Protein, ur NEGATIVE  NEGATIVE mg/dL   Urobilinogen, UA 0.2  0.0 - 1.0 mg/dL   Nitrite NEGATIVE  NEGATIVE   Leukocytes, UA NEGATIVE  NEGATIVE   Comment: MICROSCOPIC NOT DONE ON URINES WITH NEGATIVE PROTEIN, BLOOD, LEUKOCYTES, NITRITE, OR GLUCOSE <1000 mg/dL.  CBC     Status: Abnormal   Collection Time    01/02/13  6:30 PM      Result Value Range   WBC 9.9  4.0 - 10.5 K/uL   RBC 4.16  3.87 - 5.11 MIL/uL   Hemoglobin 12.1  12.0 - 15.0 g/dL   HCT 57.8 (*) 46.9 - 62.9 %   MCV 83.7  78.0 - 100.0 fL   MCH 29.1  26.0 - 34.0 pg  MCHC 34.8  30.0 - 36.0 g/dL   RDW 16.1  09.6 - 04.5 %   Platelets 182  150 - 400 K/uL  COMPREHENSIVE METABOLIC PANEL     Status: Abnormal   Collection Time    01/02/13  6:30 PM      Result Value Range   Sodium 138  135 - 145 mEq/L   Potassium 3.4 (*) 3.5 - 5.1 mEq/L   Chloride 105  96 - 112 mEq/L   CO2 23  19 - 32 mEq/L   Glucose, Bld 96  70 - 99 mg/dL   BUN 6  6 - 23 mg/dL   Creatinine, Ser 4.09 (*) 0.50 - 1.10 mg/dL   Calcium 8.6  8.4 - 81.1 mg/dL   Total Protein 6.3  6.0 - 8.3 g/dL   Albumin 2.8 (*) 3.5 - 5.2 g/dL   AST 11  0 - 37 U/L   ALT 10  0 - 35 U/L   Alkaline Phosphatase 64  39 - 117 U/L   Total Bilirubin 0.4  0.3 - 1.2 mg/dL   GFR calc non Af Amer >90  >90 mL/min   GFR calc Af Amer >90  >90 mL/min   Comment:            The eGFR has been calculated     using the CKD EPI equation.     This calculation has not been      validated in all clinical     situations.     eGFR's persistently     <90 mL/min signify     possible Chronic Kidney Disease.   ASSESSMENT 1. Migraine headache     PLAN NS x1000 ml, Benadryl 25 mg IV, Reglan 10 mg IV, Decadron 10 mg IV--complete relief of pt headache in MAU Discharge home Fioricet prescribed if h/a returns  F/U at [facility] Return to MAU as needed    Medication List         acetaminophen 500 MG tablet  Commonly known as:  TYLENOL  Take 1,000 mg by mouth every 6 (six) hours as needed for pain.     butalbital-acetaminophen-caffeine 50-325-40 MG per tablet  Commonly known as:  FIORICET  Take 1-2 tablets by mouth every 6 (six) hours as needed for headache.     prenatal multivitamin Tabs  Take 1 tablet by mouth daily at 12 noon.           Follow-up Information   Follow up with [facility].   Contact information:   [address] [place] [address] [phone]      Sharen Counter Certified Nurse-Midwife 01/02/2013  6:42 PM

## 2013-01-24 ENCOUNTER — Ambulatory Visit (INDEPENDENT_AMBULATORY_CARE_PROVIDER_SITE_OTHER): Payer: Self-pay | Admitting: Family Medicine

## 2013-01-24 ENCOUNTER — Encounter: Payer: Self-pay | Admitting: Family Medicine

## 2013-01-24 VITALS — BP 105/64 | Wt 178.0 lb

## 2013-01-24 DIAGNOSIS — Z3480 Encounter for supervision of other normal pregnancy, unspecified trimester: Secondary | ICD-10-CM

## 2013-01-24 DIAGNOSIS — Z3482 Encounter for supervision of other normal pregnancy, second trimester: Secondary | ICD-10-CM

## 2013-01-24 NOTE — Progress Notes (Signed)
Doing well--anatomy reviewed and WNL.

## 2013-01-24 NOTE — Patient Instructions (Addendum)
Embarazo - Segundo trimestre (Pregnancy - Second Trimester) El segundo trimestre del embarazo (del 3 al 6 mes) es un perodo de evolucin rpida para usted y el beb. Hacia el final del sexto mes, el beb mide aproximadamente 23 cm y pesa 680 g. Comenzar a sentir los movimientos del beb entre las 18 y las 20 semanas de embarazo. Podr sentir las pataditas ("quickening en ingls"). Hay un rpido aumento de peso. Puede segregar un lquido claro (calostro) de las mamas. Quizs sienta pequeas contracciones en el vientre (tero) Esto se conoce como falso trabajo de parto o contracciones de Braxton-Hicks. Es como una prctica del trabajo de parto que se produce cuando el beb est listo para salir. Generalmente los problemas de vmitos matinales ya se han superado hacia el final del primer trimestre. Algunas mujeres desarrollan pequeas manchas oscuras (que se denominan cloasma, mscara del embarazo) en la cara que normalmente se van luego del nacimiento del beb. La exposicin al sol empeora las manchas. Puede desarrollarse acn en algunas mujeres embarazadas, y puede desaparecer en aquellas que ya tienen acn. EXAMENES PRENATALES  Durante los exmenes prenatales, deber seguir realizando pruebas de sangre, segn avance el embarazo. Estas pruebas se realizan para controlar su salud y la del beb. Tambin se realizan anlisis de sangre para conocer los niveles de hemoglobina. La anemia (bajo nivel de hemoglobina) es frecuente durante el embarazo. Para prevenirla, se administran hierro y vitaminas. Tambin se le realizarn exmenes para saber si tiene diabetes entre las 24 y las 28 semanas del embarazo. Podrn repetirle algunas de las pruebas que le hicieron previamente.  En cada visita le medirn el tamao del tero. Esto se realiza para asegurarse de que el beb est creciendo correctamente de acuerdo al estado del embarazo.  Tambin en cada visita prenatal controlarn su presin arterial. Esto se realiza  para asegurarse de que no tenga toxemia.  Se controlar su orina para asegurarse de que no tenga infecciones, diabetes o protena en la orina.  Se controlar su peso regularmente para asegurarse que el aumento ocurre al ritmo indicado. Esto se hace para asegurarse que usted y el beb tienen una evolucin normal.  En algunas ocasiones se realiza una prueba de ultrasonido para confirmar el correcto desarrollo y evolucin del beb. Esta prueba se realiza con ondas sonoras inofensivas para el beb, de modo que el profesional pueda calcular ms precisamente la fecha del parto. Algunas veces se realizan pruebas especializadas del lquido amnitico que rodea al beb. Esta prueba se denomina amniocentesis. El lquido amnitico se obtiene introduciendo una aguja en el vientre (abdomen). Se realiza para controlar los cromosomas en aquellos casos en los que existe alguna preocupacin acerca de algn problema gentico que pueda sufrir el beb. En ocasiones se lleva a cabo cerca del final del embarazo, si es necesario inducir al parto. En este caso se realiza para asegurarse que los pulmones del beb estn lo suficientemente maduros como para que pueda vivir fuera del tero. CAMBIOS QUE OCURREN EN EL SEGUNDO TRIMESTRE DEL EMBARAZO Su organismo atravesar numerosos cambios durante el embarazo. Estos pueden variar de una persona a otra. Converse con el profesional que la asiste acerca los cambios que usted note y que la preocupen.  Durante el segundo trimestre probablemente sienta un aumento del apetito. Es normal tener "antojos" de ciertas comidas. Esto vara de una persona a otra y de un embarazo a otro.  El abdomen inferior comenzar a abultarse.  Podr tener la necesidad de orinar con ms frecuencia debido a   que el tero y el beb presionan sobre la vejiga. Tambin es frecuente contraer ms infecciones urinarias durante el embarazo. Puede evitarlas bebiendo gran cantidad de lquidos y vaciando la vejiga antes y  despus de mantener relaciones sexuales.  Podrn aparecer las primeras estras en las caderas, abdomen y mamas. Estos son cambios normales del cuerpo durante el embarazo. No existen medicamentos ni ejercicios que puedan prevenir estos cambios.  Es posible que comience a desarrollar venas inflamadas y abultadas (varices) en las piernas. El uso de medias de descanso, elevar sus pies durante 15 minutos, 3 a 4 veces al da y limitar la sal en su dieta ayuda a aliviar el problema.  Podr sentir acidez gstrica a medida que el tero crece y presiona contra el estmago. Puede tomar anticidos, con la autorizacin de su mdico, para aliviar este problema. Tambin es til ingerir pequeas comidas 4 a 5 veces al da.  La constipacin puede tratarse con un laxante o agregando fibra a su dieta. Beber grandes cantidades de lquidos, comer vegetales, frutas y granos integrales es de gran ayuda.  Tambin es beneficioso practicar actividad fsica. Si ha sido una persona activa hasta el embarazo, podr continuar con la mayora de las actividades durante el mismo. Si ha sido menos activa, puede ser beneficioso que comience con un programa de ejercicios, como realizar caminatas.  Puede desarrollar hemorroides hacia el final del segundo trimestre. Tomar baos de asiento tibios y utilizar cremas recomendadas por el profesional que lo asiste sern de ayuda para los problemas de hemorroides.  Tambin podr sentir dolor de espalda durante este momento de su embarazo. Evite levantar objetos pesados, utilice zapatos de taco bajo y mantenga una buena postura para ayudar a reducir los problemas de espalda.  Algunas mujeres embarazadas desarrollan hormigueo y adormecimiento de la mano y los dedos debido a la hinchazn y compresin de los ligamentos de la mueca (sndrome del tnel carpiano). Esto desaparece una vez que el beb nace.  Como sus pechos se agrandan, necesitar un sujetador ms grande. Use un sostn de soporte,  cmodo y de algodn. No utilice un sostn para amamantar hasta el ltimo mes de embarazo si va a amamantar al beb.  Podr observar una lnea oscura desde el ombligo hacia la zona pbica denominada linea nigra.  Podr observar que sus mejillas se ponen coloradas debido al aumento de flujo sanguneo en la cara.  Podr desarrollar "araitas" en la cara, cuello y pecho. Esto desaparece una vez que el beb nace. INSTRUCCIONES PARA EL CUIDADO DOMICILIARIO  Es extremadamente importante que evite el cigarrillo, hierbas medicinales, alcohol y las drogas no prescriptas durante el embarazo. Estas sustancias qumicas afectan la formacin y el desarrollo del beb. Evite estas sustancias durante todo el embarazo para asegurar el nacimiento de un beb sano.  La mayor parte de los cuidados que se aconsejan son los mismos que los indicados para el primer trimestre del embarazo. Cumpla con las citas tal como se le indic. Siga las instrucciones del profesional que lo asiste con respecto al uso de los medicamentos, el ejercicio y la dieta.  Durante el embarazo debe obtener nutrientes para usted y para su beb. Consuma alimentos balanceados a intervalos regulares. Elija alimentos como carne, pescado, leche y otros productos lcteos descremados, vegetales, frutas, panes integrales y cereales. El profesional le informar cul es el aumento de peso ideal.  Las relaciones sexuales fsicas pueden continuarse hasta cerca del fin del embarazo si no existen otros problemas. Estos problemas pueden ser la prdida temprana (  prematura) de lquido amnitico de las membranas, sangrado vaginal, dolor abdominal u otros problemas mdicos o del embarazo.  Realice actividad fsica todos los das, si no tiene restricciones. Consulte con el profesional que la asiste si no sabe con certeza si determinados ejercicios son seguros. El mayor aumento de peso tiene lugar durante los ltimos 2 trimestres del embarazo. El ejercicio la ayudar  a:  Controlar su peso.  Ponerla en forma para el parto.  Ayudarla a perder peso luego de haber dado a luz.  Use un buen sostn o como los que se usan para hacer deportes para aliviar la sensibilidad de las mamas. Tambin puede serle til si lo usa mientras duerme. Si pierde calostro, podr utilizar apsitos en el sostn.  No utilice la baera con agua caliente, baos turcos y saunas durante el embarazo.  Utilice el cinturn de seguridad sin excepcin cuando conduzca. Este la proteger a usted y al beb en caso de accidente.  Evite comer carne cruda, queso crudo, y el contacto con los utensilios y desperdicios de los gatos. Estos elementos contienen grmenes que pueden causar defectos de nacimiento en el beb.  El segundo trimestre es un buen momento para visitar a su dentista y evaluar su salud dental si an no lo ha hecho. Es importante mantener los dientes limpios. Utilice un cepillo de dientes blando. Cepllese ms suavemente durante el embarazo.  Es ms fcil perder algo de orina durante el embarazo. Apretar y fortalecer los msculos de la pelvis la ayudar con este problema. Practique detener la miccin cuando est en el bao. Estos son los mismos msculos que necesita fortalecer. Son tambin los mismos msculos que utiliza cuando trata de evitar los gases. Puede practicar apretando estos msculos 10 veces, y repetir esto 3 veces por da aproximadamente. Una vez que conozca qu msculos debe apretar, no realice estos ejercicios durante la miccin. Puede favorecerle una infeccin si la orina vuelve hacia atrs.  Pida ayuda si tiene necesidades econmicas, de asesoramiento o nutricionales durante el embarazo. El profesional podr ayudarla con respecto a estas necesidades, o derivarla a otros especialistas.  La piel puede ponerse grasa. Si esto sucede, lvese la cara con un jabn suave, utilice un humectante no graso y maquillaje con base de aceite o crema. CONSUMO DE MEDICAMENTOS Y DROGAS  DURANTE EL EMBARAZO  Contine tomando las vitaminas apropiadas para esta etapa tal como se le indic. Las vitaminas deben contener un miligramo de cido flico y deben suplementarse con hierro. Guarde todas las vitaminas fuera del alcance de los nios. La ingestin de slo un par de vitaminas o tabletas que contengan hierro puede ocasionar la muerte en un beb o en un nio pequeo.  Evite el uso de medicamentos, inclusive los de venta libre y hierbas que no hayan sido prescriptos o indicados por el profesional que la asiste. Algunos medicamentos pueden causar problemas fsicos al beb. Utilice los medicamentos de venta libre o de prescripcin para el dolor, el malestar o la fiebre, segn se lo indique el profesional que lo asiste. No utilice aspirina.  El consumo de alcohol est relacionado con ciertos defectos de nacimiento. Esto incluye el sndrome de alcoholismo fetal. Debe evitar el consumo de alcohol en cualquiera de sus formas. El cigarrillo causa nacimientos prematuros y bebs de bajo peso. El uso de drogas recreativas est absolutamente prohibido. Son muy nocivas para el beb. Un beb que nace de una madre adicta, ser adicto al nacer. Ese beb tendr los mismos sntomas de abstinencia que un adulto.    Infrmele al profesional si consume alguna droga.  No consuma drogas ilegales. Pueden causarle mucho dao al beb. SOLICITE ATENCIN MDICA SI: Tiene preguntas o preocupaciones durante su embarazo. Es mejor que llame para consultar las dudas que esperar hasta su prxima visita prenatal. De esta forma se sentir ms tranquila.  SOLICITE ATENCIN MDICA DE INMEDIATO SI:  La temperatura oral se eleva sin motivo por encima de 102 F (38.9 C) o segn le indique el profesional que lo asiste.  Tiene una prdida de lquido por la vagina (canal de parto). Si sospecha una ruptura de las membranas, tmese la temperatura y llame al profesional para informarlo sobre esto.  Observa unas pequeas manchas,  una hemorragia vaginal o elimina cogulos. Notifique al profesional acerca de la cantidad y de cuntos apsitos est utilizando. Unas pequeas manchas de sangre son algo comn durante el embarazo, especialmente despus de mantener relaciones sexuales.  Presenta un olor desagradable en la secrecin vaginal y observa un cambio en el color, de transparente a blanco.  Contina con las nuseas y no obtiene alivio de los remedios indicados. Vomita sangre o algo similar a la borra del caf.  [place] o sube ms de 900 g. en una semana, o segn lo indicado por el profesional que la asiste.  Observa que se le hinchan el rostro, las manos, los pies o las piernas.  Ha estado expuesta a la rubola y no ha sufrido la enfermedad.  Ha estado expuesta a la quinta enfermedad o a la varicela.  Presenta dolor abdominal. Las molestias en el ligamento redondo son una causa no cancerosa (benigna) frecuente de dolor abdominal durante el embarazo. El profesional que la asiste deber evaluarla.  Presenta dolor de cabeza intenso que no se alivia.  Presenta fiebre, diarrea, dolor al orinar o le falta la respiracin.  Presenta dificultad para ver, visin borrosa, o visin doble.  Sufre una cada, un accidente de trnsito o cualquier tipo de trauma.  Vive en un hogar en el que existe violencia fsica o mental. Document Released: 03/24/2005 Document Revised: 03/08/2012 [employer] Patient Information 2014 [employer].  Lactancia materna  (Breastfeeding)  El cambio hormonal durante el embarazo produce el desarrollo del tejido mamario y un aumento en el nmero y tamao de los conductos galactforos. La hormona prolactina permite que las protenas, los azcares y las grasas de la sangre produzcan la leche materna en las glndulas productoras de leche. La hormona progesterona impide que la leche materna sea liberada antes del nacimiento del beb. Despus del nacimiento del beb, su nivel de progesterona disminuye  permitiendo que la leche materna sea liberada. Pensar en el beb, as como la succin o el llanto, pueden estimular la liberacin de leche de las glndulas productoras de leche.  La decisin de amamantar (lactar) es una de las mejores opciones que usted puede hacer para usted y su beb. La informacin que sigue da una breve resea de los beneficios, as como otras caractersticas importantes que debe saber sobre la lactancia materna.  LOS BENEFICIOS DE AMAMANTAR  Para el beb   La primera leche (calostro) ayuda al mejor funcionamiento del sistema digestivo del beb.   La leche tiene anticuerpos que provienen de la madre y que ayudan a prevenir las infecciones en el beb.   El beb tiene una menor incidencia de asma, alergias y del sndrome de muerte sbita del lactante (SMSL).   Los nutrientes de la leche materna son mejores para el beb que la leche maternizada.  La leche materna mejora   el desarrollo cerebral del beb.   Su beb tendr menos gases, clicos y estreimiento.  Es menos probable que el beb desarrolle otras enfermedades, como obesidad infantil, asma o diabetes mellitus. Para usted   La lactancia materna favorece el desarrollo de un vnculo muy especial entre la madre y el beb.   Es ms conveniente, siempre disponible, a la temperatura adecuada y econmica.   La lactancia materna ayuda a quemar caloras y a perder el peso ganado durante el embarazo.   Hace que el tero se contraiga ms rpidamente a su tamao normal y disminuye el sangrado despus del parto.   Las madres que amamantan tienen menos riesgo de desarrollar osteoporosis o cncer de mama o de ovario en el futuro.  FRECUENCIA DEL AMAMANTAMIENTO   Un beb sano, nacido a trmino, puede amamantarse con tanta frecuencia como cada hora, o espaciar las comidas cada tres horas. La frecuencia en la lactancia varan de un beb a otro.   Los recin nacidos deben ser alimentados por lo menos cada 2-3 horas  durante el da y cada 4-5 horas durante la noche. Usted debe amamantarlo un mnimo de 8 tomas en un perodo de 24 horas.  Despierte al beb para amamantarlo si han pasado 3-4 horas desde la ltima comida.  Amamante cuando sienta la necesidad de reducir la plenitud de sus senos o cuando el beb muestre signos de hambre. Las seales de que el beb puede tener hambre son:  Aumenta su estado de alerta o vigilancia.  Se estira.  Mueve la cabeza de un lado a otro.  Mueve la cabeza y abre la boca cuando se le toca la mejilla o la boca (reflejo de succin).  Aumenta las vocalizaciones, tales como sonidos de succin, relamerse los labios, arrullos, suspiros, o chirridos.  Mueve la mano hacia la boca.  Se chupa con ganas los dedos o las manos.  Agitacin.  Llanto intermitente.  Los signos de hambre extrema requerirn que lo calme y lo consuele antes de tratar de alimentarlo. Los signos de hambre extrema son:  Agitacin.  Llanto fuerte e intenso.  Gritos.  El amamantamiento frecuente la ayudar a producir ms leche y a prevenir problemas de dolor en los pezones e hinchazn de las mamas.  LACTANCIA MATERNA   Ya sea que se encuentre acostada o sentada, asegrese que el abdomen del beb est enfrente el suyo.   Sostenga la mama con el pulgar por arriba y los otros 4 dedos por debajo del pezn. Asegrese que sus dedos se encuentren lejos del pezn y de la boca del beb.   Empuje suavemente los labios del beb con el pezn o con el dedo.   Cuando la boca del beb se abra lo suficiente, introduzca el pezn y la zona oscura que lo rodea (areola) tanto como le sea posible dentro de la boca.  Debe haber ms areola visible por arriba del labio superior que por debajo del labio inferior.  La lengua del beb debe estar entre la enca inferior y el seno.  Asegrese de que la boca del beb est en la posicin correcta alrededor del pezn (prendida). Los labios del beb deben crear un sello  sobre su pecho.  Las seales de que el beb se ha prendido eficazmente al pezn son:  Tironea o succiona sin dolor.  Se escucha que traga entre las succiones.  No hace ruidos ni chasquidos.  Hay movimientos musculares por arriba y por delante de sus odos al succionar.  El beb debe   succionar unos 2-3 minutos para que salga la leche. Permita que el nio se alimente en cada mama todo lo que desee. Alimente al beb hasta que se desprenda o se quede dormido en el primer pecho y luego ofrzcale el segundo pecho.  Las seales de que el beb est lleno y satisfecho son:  Disminuye gradualmente el nmero de succiones o no succiona.  Se queda dormido.  Extiende o relaja su cuerpo.  Retiene una pequea cantidad de leche en la boca.  Se desprende del pecho por s mismo.  Los signos de una lactancia materna eficaz son:  Los senos han aumentado la firmeza, el peso y el tamao antes de la alimentacin.  Son ms blandos despus de amamantar.  Un aumento del volumen de leche, y tambin el cambio de su consistencia y color se producen hacia el quinto da de lactancia materna.  La congestin mamaria se alivia al dar de mamar.  Los pezones no duelen, ni estn agrietados ni sangran.  De ser necesario, interrumpa la succin poniendo su dedo en la esquina de la boca del beb y deslizando el dedo entre sus encas. A continuacin, retire la mama de su boca.  Es comn que los bebs regurgiten un poco despus de comer.  A menudo los bebs tragan aire al alimentarse. Esto puede hacer que se sienta molesto. Hacer eructar al beb al cambiar de pecho puede ser de ayuda.  Se recomiendan suplementos de vitamina D para los bebs que reciben slo leche materna.  Evite el uso del chupete durante las primeras 4 a 6 semanas de vida.  Evite la alimentacin suplementaria con agua, frmula o jugo en lugar de la leche materna. La leche materna es todo el alimento que el beb necesita. No es necesario que el  nio ingiera agua o preparados de bibern. Sus pechos producirn ms leche si se evita la alimentacin suplementaria durante las primeras semanas. COMO SABER SI EL BEB OBTIENE LA SUFICIENTE LECHE MATERNA  Preguntarse si el beb obtiene la cantidad suficiente de leche es una preocupacin frecuente entre las madres. Puede asegurarse que el beb tiene la leche suficiente si:   El beb succiona activamente y usted escucha que traga.   El beb parece estar relajado y satisfecho despus de mamar.   El nio se alimenta al menos 8 a 12 veces en 24 horas.  Durante los primeros 3 a 5 das de vida:  Moja 3-5 paales en 24 horas. La materia fecal debe ser blanda y amarillenta.  Tiene al menos 3 a 4 deposiciones en 24 horas. La materia fecal debe ser blanda y amarillenta.  A los 5-7 das de vida, el beb debe tener al menos 3-6 deposiciones en 24 horas. La materia fecal debe ser grumosa y amarilla a los 5 das de vida.  Su beb tiene una prdida de peso menor a 7al 10% durante los primeros 3 das de vida.  El beb no pierde peso despus de 3-7 das de vida.  El beb debe aumentar 4 a 6 libras (120 a 170 gr.) por semana despus de los 4 das de vida.  Aumenta de peso a los 5 das de vida y vuelve al peso del nacimiento dentro de las 2 semanas. CONGESTIN MAMARIA  Durante la primera semana despus del parto, usted puede experimentar hinchazn en las mamas (congestin mamaria). Al estar congestionadas, las mamas se sienten pesadas, calientes o sensibles al tacto. El pico de la congestin ocurre a las 24 -48 horas despus del parto.     La congestin puede disminuirse:  Continuando con la lactancia materna.  Aumentando la frecuencia.  Tomando duchas calientes o aplicando calor hmedo en los senos antes de cada comida. Esto aumenta la circulacin y ayuda a que la leche fluya.   Masajeando suavemente el pecho antes y durante las comidas. Con las yemas de los dedos, masajee la pared del pecho hacia  el pezn en un movimiento circular.   Asegurarse de que el beb vaca al menos uno de sus pechos en cada alimentacin. Tambin ayuda si comienza la siguiente toma en el otro seno.   Extraiga manualmente o con un sacaleches las mamas para vaciar los pechos si el beb tiene sueo o no se aliment bien. Tambin puede extraer la leche cuando vuelva a trabajar o si siente que se estn congestionando las mamas.  Asegrese de que el beb se prende y est bien colocado durante la lactancia. Si sigue estas indicaciones, la congestin debe mejorar en 24 a 48 horas. Si an tiene dificultades, consulte a su asesor en lactancia.  CUDESE USTED MISMA  Cuide sus mamas.   Bese o dchese diariamente.   Evite usar jabn en los pezones.   Use un sostn de soporte Evite el uso de sostenes con aro.  Seque al aire sus pezones durante 3-4 minutos despus de cada comida.   Utilice slo apsitos de algodn en el sostn para absorber las prdidas de leche. La prdida de un poco de leche materna entre las comidas es normal.   Use solamente lanolina pura en sus pezones despus de amamantar. Usted no tiene que lavarla antes de alimentar al beb. Otra opcin es sacarse unas gotas de leche y masajear suavemente los pezones.  Continuar con los autocontroles de la mama. Cudese.   Consuma alimentos saludables. Alterne 3 comidas con 3 colaciones.  Evite los alimentos que usted nota que perjudican al beb.  Beba leche, jugos de fruta y agua para satisfacer su sed (aproximadamente 8 vasos al da).   Descanse con frecuencia, reljese y tome sus vitaminas prenatales para evitar la fatiga, el estrs y la anemia.  Evite masticar y fumar tabaco.  Evite el consumo de alcohol y drogas.  Tome medicamentos de venta libre y recetados tal como le indic su mdico o farmacutico. Siempre debe consultar con su mdico o farmacutico antes de tomar cualquier medicamento, vitamina o suplemento de hierbas.  Sepa que  durante la lactancia puede quedar embarazada. Si lo desea, hable con su mdico acerca de la planificacin familiar y los mtodos anticonceptivos seguros que puede utilizar durante la lactancia. SOLICITE ATENCIN MDICA SI:   Usted siente que quiere dejar de amamantar o se siente frustrada con la lactancia.  Siente dolor en los senos o en los pezones.  Sus pezones estn agrietados o sangran.  Sus pechos estn irritados, sensibles o calientes.  Tiene un rea hinchada en cualquiera de los senos.  Siente escalofros o fiebre.  Tiene nuseas o vmitos.  Observa un drenaje en los pezones.  Sus mamas no se llenan antes de amamantarlo al 5to da despus del parto.  Se siente triste y deprimida.  El nio est demasiado somnoliento como para comer.  El nio tiene problemas para respirar.   Moja menos de 3 paales en 24 horas.  Mueve el intestino menos de 3 veces en 24 horas.  La piel del beb o la parte blanca de sus ojos est ms amarilla.   El beb no ha aumentado de peso a los 5 das de vida.   ASEGRESE DE QUE:   Comprende estas instrucciones.  Controlar su enfermedad.  Solicitar ayuda de inmediato si no mejora o si empeora. Document Released: 06/14/2005 Document Revised: 03/08/2012 [employer] Patient Information 2014 [employer].  

## 2013-01-24 NOTE — Progress Notes (Signed)
P= 86  

## 2013-01-25 ENCOUNTER — Encounter: Payer: Self-pay | Admitting: Obstetrics and Gynecology

## 2013-02-21 ENCOUNTER — Ambulatory Visit (INDEPENDENT_AMBULATORY_CARE_PROVIDER_SITE_OTHER): Payer: Self-pay | Admitting: Obstetrics & Gynecology

## 2013-02-21 VITALS — BP 105/71 | Wt 178.0 lb

## 2013-02-21 DIAGNOSIS — Z789 Other specified health status: Secondary | ICD-10-CM

## 2013-02-21 DIAGNOSIS — Z758 Other problems related to medical facilities and other health care: Secondary | ICD-10-CM | POA: Insufficient documentation

## 2013-02-21 DIAGNOSIS — Z609 Problem related to social environment, unspecified: Secondary | ICD-10-CM

## 2013-02-21 DIAGNOSIS — Z3480 Encounter for supervision of other normal pregnancy, unspecified trimester: Secondary | ICD-10-CM

## 2013-02-21 DIAGNOSIS — Z3482 Encounter for supervision of other normal pregnancy, second trimester: Secondary | ICD-10-CM

## 2013-02-21 DIAGNOSIS — O34219 Maternal care for unspecified type scar from previous cesarean delivery: Secondary | ICD-10-CM

## 2013-02-21 NOTE — Progress Notes (Signed)
Patient is Spanish-speaking only, Spanish interpreter present for this encounter. Counseled about Tdap, will receive at next visit. Third trimester labs also next visit.  No other complaints or concerns.  Fetal movement and labor precautions reviewed.

## 2013-02-21 NOTE — Progress Notes (Signed)
[id number]  

## 2013-02-21 NOTE — Patient Instructions (Addendum)
Regrese a la clinica cuando tenga su cita. Si tiene problemas o preguntas, llama a la clinica o vaya a la [facility]. Vacuna difteria/ttanos (Td) o Vacuna difteria, ttanos, tos convulsa (Tdap), Lo que debe saber (Tetanus, Diphtheria [Td] or Tetanus, Diphtheria, Pertussis [Tdap] Vaccine, What You Need to Know) PORQU VACUNARSE? El ttanos , la difteria y la tos ferina pueden ser enfermedades graves.  El TTANOS  (trismo) provoca la contraccin dolorosa y rigidez de los msculos, por lo general, en todo el cuerpo.   Puede causar la contraccin de los msculos de la cabeza y el cuello de modo que el enfermo no puede abrir la boca ni tragar., y en algunos casos, tampoco puede respirar.. El ttanos causa la muerte de 1 de cada 5 personas que se infectan. LA DIFTERIA produce la formacin de una membrana gruesa que cubre el fondo de la garganta.  Puede causar problemas respiratorios, parlisis, insuficiencia cardaca, e incluso la muerte. El PERTUSIS (tos ferina) causa ataques de tos intensa que pueden dificultar la respiracin, provocar vmitos e interrumpir el sueo.   Puede causar prdida de peso, incontinencia, fractura de costillas, y desmayos por la intensa tos. Hasta de 2 de cada 100 adolescentes y 5 de cada 100 adultos que enferman de tos ferina deben ser hospitalizados o tienen complicaciones como la neumona y la muerte. Estas 3 enfermedades son provocadas por bacterias. La difteria y la tos ferina se contagian de persona a persona. El ttanos ingresa al organismo a travs de cortes, rasguos o heridas. En los [place] ocurran alrededor de 200 000 casos por ao de difteria y tos ferina, antes de que existieran las vacunas, y tambin ocurran cientos de casos de ttanos. Desde la aparicin de las vacunas, el ttanos y la difteria han disminuido en alrededor del 99% y los casos de tos ferina disminuyeron aproximadamente el 92%.  Los nios menores de 6 aos  deben recibir la vacuna DTaP para estar protegidos contra estas tres enfermedades. Pero los nios mayores, los adolescentes y los adultos tambin necesitan proteccin. VACUNAS PARA ADOLESCENTES Y ADULTOS Vacunas Tdap y Td  Hay dos vacunas disponibles para proteger de estas enfermedades a nios a partir de los 7aos:   La vacuna Td fue utilizada durante muchos aos. Protege contra el ttanos y la difteria.  La vacuna Tdap fue autorizada en 2005. Es la primera vacuna para adolescentes y adultos que protege contra la tos ferina y el ttanos y la difteria. Una dosis de refuerzo de la Td se recomienda cada 10 aos. La Tdap se aplica slo una vez.  QU VACUNA DEBO APLICARME Y CUANDO? Las edades de 7 a 18 aos  Entre los 11 y los 12 aos se recomienda una dosis de Tdap. Esta dosis puede aplicarse desde los 7 aos en los nios que no han recibido una o ms dosis de DTaP anteriormente.  Los nios y adolescentes que no recibieron todas las dosis programadas de DTaP o DTP a los 7 aos deben completar la serie usando una combinacin de Td y Tdap. Adultos de 19 aos o ms  Todos los adultos deben recibir una dosis de refuerzo de Td cada 10 aos. Los adultos de menos de 65 aos que nunca hayan recibido la Tdap deben reemplazarla por la siguiente dosis de refuerzo. Los adultos a partir de los 65 aos puedenrecibir una dosis de Tdap.  Los adultos (incluyendo las mujeres que podran quedar embarazadas y los adultos mayores de 65 aos) que   tienen contacto cercano con un beb menor de 12 meses deben aplicarse una dosis de Tdap para proteger al beb de la tos ferina.  Los trabajadores de la salud que tengan contacto directo con pacientes en hospitales o clnicas deben recibir una dosis de Tdap. Proteccin despus de una herida  Es posible que una persona que tenga un corte o quemadura grave necesite una dosis de Td o Tdap para prevenir la infeccin por ttanos. Puede usarse la Tdap en personas que nunca  recibieron una dosis. Pero debe usarse la Td, si la Tdap no se encuentra disponible, o para:  Cualquier persona que haya recibido una dosis de Tdap.  Los nios entre los 7 y los 9 aos que han completado las series de DTap anteriormente.  Adultos de 65 aos o ms. Mujeres embarazadas.   Las mujeres embarazadas que nunca recibieron una dosis de Ddap deben recibirla despus de la 20a semana de gestacin y preferiblemente durante el 3er. trimestre. Si no se aplican la Tdap durante el embarazo, deben recibirla lo antes posible despus del parto. Las mujeres embarazadas que han recibido la Tdap y tienen que aplicarse la vacuna contra el ttanos o la difteria durante el embarazo, deben recibir la Td. Las vacunas Tdap y Td pueden ser administradas al mismo tiempo que otras vacunas. ALGUNAS PERSONAS NO DEBEN RECIBIR LA VACUNA O DEBEN ESPERAR  Las personas que hayan tenido una reaccin alrgica que haya puesto en peligro su vida despus de una dosis de vacuna contra el ttanos, la difteria o la tos ferina no deben recibir Td ni Tdap..  Las personas que tengan alergias graves a algn componente de una vacuna no deben recibir esa vacuna. Informe a su mdico si la persona que recibe la vacuna sufre alergias graves.  Cualquier persona que haya estado en coma o que haya tenido convulsiones dentro de los 7 das posteriores despus de una dosis de DTP o DTaP no debe recibir la Tdap, salvo que se encuentre una causa que no fuera la vacuna. Estas personas pueden recibir Td.  Consulte a su mdico si la persona que recibe alguna de las vacunas:  Tiene epilepsia o algn otro problema del sistema nervioso.  Tuvo inflamacin o dolor intenso despus de una dosis de DTP, DTaP, DT, Td, o Tdap.  Ha tenido el sndrome de Guillain Barr (GBS por sus siglas en ingls). Las personas que sufran una enfermedad moderada o grave el da en que se programa la vacuna, deben esperar a recuperarse para recibir las vacunas Tdap o  Td. Por lo general, una persona con una enfermedad leve o fiebre baja puede recibir la vacuna. CULES SON LOS RIESGOS DE LAS VACUNAS TDAP Y TD? Con una vacuna, al igual que con cualquier medicamento, siempre existe un pequeo riesgo de una reaccin alrgica que ponga en peligro la vida o cause otro problema grave. Todo procedimiento mdico, inclusive la vacunacin pueden causar breves episodios de lipotimia o sntomas relacionados (como movimientos espasmdicos). Para evitar los desmayos y las lesiones causadas por las cadas, permanezca sentado o recustese durante los 15 minutos posteriores a la vacunacin. Informe a su mdico si el paciente se siente dbil o mareado, tiene cambios en la visin o siente zumbidos en los odos.  Es mucho ms probable que tener ttanos, difteria, o tos ferina cause problemas ms graves que los provocados por recibir cualquiera de las vacunas Td o Tdap. A continuacin se enumeran los problemas informados despus de las vacunas Td y Tdap. Problemas Leves (perceptibles,   pero que no interfirieron con las actividades): Tdap  Dolor (alrededor de 3 de cada 4 adolescentes y 2 de cada 3 adultos).  Enrojecimiento o inflamacin en el sitio de la inyeccin (alrededor de 1 de cada 5).  Fiebre leve de al menos 100.4 F (38 C) (hasta alrededor de 1 cada 25 adolescentes y 1 de cada 100 adultos).  Dolor de cabeza (alrededor de 4 de cada 10 adolescentes y 3 de cada 10 adultos).  Cansancio (alrededor de 1 de cada 3 adolescentes y 1 de cada 4 adultos).  Nuseas, vmitos, diarrea, o dolor de estmago (hasta 1 de cada 4 adolescentes y 1 de cada 10 adultos).  Escalofros, dolores corporales, dolor articular, erupciones, o inflamacin de las glndulas (poco frecuente). Td  Dolor (hasta alrededor de 8 de cada 10).  Enrojecimiento o inflamacin de la inyeccin (alrededor de 1 de cada 3).  Fiebre leve (hasta alrededor de 1 de cada 5).  Dolor de cabeza o cansancio (poco  frecuente). Problemas Moderados (interfieren con las actividades, pero no requieren atencin mdica): Tdap  Dolor en el sitio de la inyeccin (alrededor de 1 de cada 20 adolescentes y 1 de cada 100 adultos).  Enrojecimiento o inflamacin de la inyeccin (alrededor de 1 de cada 16 adolescentes y 1 de cada 25 adultos).  Fiebre de ms de 102 F (38.9 C) (alrededor de 1 de cada 100 adolescentes y 1 de cada 250 adultos).  Dolor de cabeza (1 de cada 300).  Nuseas, vmitos, diarrea, o dolor de estmago (hasta 3 de cada 100 adolescentes y 1 de cada 100 adultos). Td  Fiebre de ms de 102 F (38.9 C) (poco comn). Tdap o Td  Inflamacin de gran extensin en el brazo en el que se aplic la vacuna (hasta 3 de cada 100). Problemas Graves (no puede realizar actividades habituales; requiere atencin mdica) Tdap o Td  Inflamacin, dolor intenso, sangrado y enrojecimiento en el brazo, en el sitio de la inyeccin (poco frecuente). Puede producirse una reaccin alrgica grave despus de cualquier vacuna. Se estima que estas reacciones ocurren en menos de una de cada un milln de dosis. QU PASA SI HAY UNA REACCIN GRAVE? Qu signos debo buscar? Cualquier estado poco habitual, como una reaccin alrgica grave o fiebre alta. Si le produce una reaccin alrgica grave, se manifestar dentro de algunos minutos a una hora despus de recibir la vacuna. Entre los signos de reaccin alrgica grave se encuentran la dificultad para respirar, debilidad, ronquera o sibilancias, latidos cardacos acelerados, urticaria, mareos, palidez, o inflamacin de la garganta. Qu debo hacer?  Comunquese con su mdico o lleve inmediatamente a la persona al mdico.  Dgale a su mdico qu ocurri, la fecha y hora en que sucedi y cundo le aplicaron la vacuna.  Pida a su mdico que informe sobre la reaccin llenando un formulario del Sistema de Informacin de Reacciones Adversos a las Vacunas (VAERS, por sus siglas en  ingls). O, puede presentar este informe a travs del sitio web de VAERS enwww.vaers.hhs.gov o puede llamar al 1-800-822-7967. VAERS no brinda asistencia mdica. PROGRAMA NACIONAL DE COMPENSACIN DE DAOS POR VACUNAS El Programa Nacional de Compensacin de Daos por Vacunas (VICP) fue creado en 1986.  Aquellas personas que consideren que han sufrido un dao como consecuencia de una vacuna y quieren saber ms acerca del programa y como presentar una denuncia, pueden llamar al 1-800-338-2382 o visitar su sitio web en www.hrsa.gov/vaccinecompensation  CMO PUEDO OBTENER MS INFORMACIN?  El profesional podr darle el prospecto de   la vacuna o sugerirle otras fuentes de informacin.  Comunquese con el servicio de salud de su localidad o su estado.  Comunquese con los Centros para el control y la prevencin de enfermedades (Centers for Disease Control and Prevention , CDC).  Llame al 1-800-232-4636 (1-800-CDC-INFO).  Visite los sitios web de CDC ubicados en www.cdc.gov/vaccines CDC Td and Tdap Interim VIS-Spanish (07/21/10) Document Released: 09/30/2008 Document Revised: 09/06/2011 [employer] Patient Information 2014 [employer].  

## 2013-03-19 ENCOUNTER — Encounter: Payer: Self-pay | Admitting: Obstetrics & Gynecology

## 2013-03-26 ENCOUNTER — Ambulatory Visit (INDEPENDENT_AMBULATORY_CARE_PROVIDER_SITE_OTHER): Payer: Self-pay | Admitting: Obstetrics & Gynecology

## 2013-03-26 VITALS — BP 114/68 | Wt 183.0 lb

## 2013-03-26 DIAGNOSIS — Z3480 Encounter for supervision of other normal pregnancy, unspecified trimester: Secondary | ICD-10-CM

## 2013-03-26 DIAGNOSIS — Z609 Problem related to social environment, unspecified: Secondary | ICD-10-CM

## 2013-03-26 DIAGNOSIS — Z603 Acculturation difficulty: Secondary | ICD-10-CM

## 2013-03-26 DIAGNOSIS — Z789 Other specified health status: Secondary | ICD-10-CM

## 2013-03-26 DIAGNOSIS — Z3483 Encounter for supervision of other normal pregnancy, third trimester: Secondary | ICD-10-CM

## 2013-03-26 DIAGNOSIS — O34219 Maternal care for unspecified type scar from previous cesarean delivery: Secondary | ICD-10-CM

## 2013-03-26 LAB — CBC
MCHC: 34 g/dL (ref 30.0–36.0)
Platelets: 198 10*3/uL (ref 150–400)
RBC: 4.08 MIL/uL (ref 3.87–5.11)
RDW: 13.9 % (ref 11.5–15.5)

## 2013-03-26 NOTE — Progress Notes (Signed)
P = [phone] 

## 2013-03-26 NOTE — Progress Notes (Signed)
Patient is Spanish-speaking only, Spanish interpreter present for this encounter. Third trimester labs this visit; will follow up results and manage accordingly. Counseled about Tdap and Flu vaccines, patient to receive at the [facility].  Will continue to watch fundal height, may need ultrasound if persistent size >>>dates, also follow up 1 hr GTT drawn today.  No other complaints or concerns.  Fetal movement and labor precautions reviewed.

## 2013-03-26 NOTE — Patient Instructions (Addendum)
Regrese a la clinica cuando tenga su cita. Si tiene problemas o preguntas, llama a la clinica o vaya a la sala de emergencia al [employer].  Vacuna antigripal (vacuna antigripal inactivada) 2013 2014, Lo que debe saber  (Influenza Vaccine [Flu Vaccine, Inactivated] 2013 2014, What You Need to Know) PORQU VACUNARSE?   La influenza ("gripe") es una enfermedad contagiosa que se propaga por los [place] en invierno, por lo general entre octubre y [place].  La causa de la gripe es el virus de la influenza, y se puede contagiar por la tos, al estornudar y por el contacto cercano.  Cualquier persona puede Writer gripe, Biomedical engineer el riesgo es mayor entre los nios. Los sntomas aparecen rpidamente y pueden durar [address]. Pueden ser:  Grant Ruts o escalofros.  Dolor de Advertising copywriter.  Dolores musculares.  La fatiga.  Tos.  Dolor de [place].  Secrecin o congestin nasal. La gripe puede hacer que algunas personas se enfermen ms que otros. Entre [employer] se incluyen a los nios pequeos, las Smith International de 65 aos, las mujeres embarazadas y las personas con Runner, broadcasting/film/video, como enfermedades cardacas, pulmonares o renales, o que tienen un sistema inmunolgico debilitado. La vacuna contra la gripe es especialmente importante para estas personas y para todos los que estn en estrecho contacto con ellos.  La gripe tambin puede causar neumona y Theme park manager las afecciones existentes. En los nios, puede provocar diarrea y convulsiones.  Cada ao miles de [employer] [place] debido a la gripe y muchos ms deben ser hospitalizados.  La vacuna contra la gripe es la mejor proteccin que existe contra la gripe y sus complicaciones. La vacuna contra la gripe tambin ayuda a prevenir la propagacin de la gripe de Neomia Dear persona a Educational psychologist.  VACUNA INACTIVADA CONTRA LA GRIPE  Hay dos tipos de vacunas contra la gripe:   Usted recibir la vacuna de la gripe inactivada, que  no contiene virus vivo. Se administra en forma de inyeccin con Marella Bile y se llama la "vacuna antigripal".  Otro tipo de vacuna con virus vivos, atenuados (debilitados), se aplica en forma de aerosol en las fosas nasales. Esta vacuna se describe en el apartado Informacin sobre las vacunas. Se recomienda aplicarse la vacuna contra la gripe todos los [place]. Los nios [employer] 6 meses y los 8 aos de edad deben recibir 2 dosis Dispensing optician que se vacunen.  Los virus de la gripe [place] constantemente. Cada ao, la vacuna contra la gripe se actualiza para proteger contra los virus que tienen ms probabilidades de causar la enfermedad ese ao. Aunque la vacuna no puede prevenir todos los casos de gripe, es nuestra mejor defensa contra la enfermedad. Vacuna contra la gripe inactivada protege contra 3 o 4 virus diferentes.  Se tarda aproximadamente 2 semanas para desarrollar la proteccin despus de la vacunacin y la proteccin dura entre algunos meses y un ao.  Muchas veces se confunden con la gripe algunas enfermedades que no son causadas por el virus de la gripe. La vacuna contra la gripe no previene estas enfermedades. Slo se puede prevenir la gripe.  Para las personas de ms de 65 aos, se dispone de una vacuna contra la gripe de "dosis elevada". La persona que aplica la vacuna puede darle ms informacin al respecto.  Algunas de las vacunas contra la gripe inactivada contienen una cantidad muy pequea de un conservante a base de mercurio llamado timerosal. Algunos estudios han demostrado que el timerosal en  las vacunas no es perjudicial, pero se dispone de vacunas contra la gripe que no contienen el conservante.  ALGUNAS PERSONAS NO DEBEN RECIBIR ESTA VACUNA Informe a la persona que le aplica la vacuna:   Si sufre alguna alergia grave (que pone en peligro la vida). Si alguna vez tuvo una reaccin alrgica potencialmente mortal despus de Neomia Dear dosis de la vacuna contra la gripe, o tuvo una alergia  grave a cualquiera de los componentes de [place], es posible que se le recomiende no recibir una dosis. La Har[employer]son de las vacunas contra la gripe, aunque no todas, contienen una pequea cantidad de [place].  Si alguna vez ha sufrido el sndrome de Pension scheme manager (una enfermedad paralizante grave tambin llamada GBS). Algunas personas con antecedentes de GBS no deben recibir esta vacuna. Debe comentarlo con su mdico.  Si no se siente bien. Podran sugerirle que espere hasta sentirse mejor. Pero debe volver. RIESGOS DE UNA REACCIN A LA VACUNA Con la vacuna, como cualquier medicamento, existe la posibilidad de sufrir efectos secundarios. Suelen ser leves y desaparecen por s solos.  Los efectos secundarios graves son [place], pero son Lynnae Sandhoff raros. Vacuna de la gripe inactivada no contiene el virus vivo de la gripe, la gripe por lo tanto enfermarse por recibir la vacuna no es posible.  Episodios de desmayo leves y sntomas relacionados (tales como sacudidas) pueden presentarse despus de cualquier procedimiento mdico, incluyendo la vacunacin. Si permanece sentado o recostado durante 15 minutos despus de la vacunacin puede ayudar a [employer] y las lesiones causadas por las cadas. Informe al mdico si se siente mareado o aturdido, tiene [employer] Industries visin o zumbidos en los odos.  Problemas leves luego de recibir la vacuna de la gripe inactivada:   Barista, enrojecimiento o Paramedic en el que le aplicaron la vacuna.  Ronquera; dolor, inflamacin o picazn en los ojos o tos.  Grant Ruts.  Dolores.  Dolor de [place].  Picazn.  Fatiga. Si estos problemas ocurren, en general comienzan poco despus de vacunarse y duran 1  2 das.  Problemas moderados luego de recibir la vacuna de la gripe inactivada:   Los nios que reciben la vacuna contra la gripe inactivada y Research scientist (medical) antineumoccica (PCV13) al mismo tiempo, pueden tener un mayor riesgo de sufrir  convulsiones causadas por fiebre. Consulte a su mdico para obtener ms informacin. Informe a su mdico si un nio que est recibiendo la vacuna contra la gripe ha tenido una convulsin. Problemas graves luego de recibir la vacuna inactivada contra la gripe:   Neomia Dear reaccin alrgica grave puede ocurrir despus de la administracin de cualquier vacuna (se estima en menos de 1 en un milln de dosis).  Hay una pequea posibilidad de que la vacuna de la gripe inactivada est asociada con el sndrome de Guillain-Barr (GBS), no ms de 1 o 2 casos por milln de personas vacunadas. Es Chief Operating Officer que el riesgo de sufrir complicaciones graves por la gripe, que puede prevenirse con la vacunacin. Se controla permanentemente la seguridad de las vacunas. Para obtener ms informacin, consulte [email or web address]/  QU PASA SI HAY UNA REACCIN GRAVE?  Qu signos debo buscar?   Observe todo lo que le preocupe, como signos de una reaccin alrgica grave, fiebre muy alta o cambios en el comportamiento. Los signos de Runner, broadcasting/film/video grave pueden incluir urticaria, hinchazn de la cara y la garganta, dificultad para respirar, ritmo cardaco acelerado, mareos y debilidad. Pueden comenzar entre unos pocos  minutos y algunas horas despus de la vacunacin.  Qu debo hacer?   Si usted piensa que se trata de una reaccin alrgica grave o de otra emergencia que no puede esperar, llame al 911 o lleve a la persona al hospital ms cercano. De lo contrario, llame a su mdico.  Despus, la reaccin debe informarse a la "Vaccine Adverse Event Reporting System" (Sistema de informacin sobre efectos adversos de las vacunas -VAERS). Su mdico puede presentar este informe, o puede hacerlo usted mismo a travs del sitio web de VAERS, en www.[email or web address].LAgents.no, o llamando al [phone]. VAERS es slo para informar reacciones. No brindan consejo mdico.  PROGRAMA NACIONAL DE COMPENSACIN DE DAOS POR VACUNAS  El  National Vaccine Injury Compensation Program (VICP) es un programa federal que fue creado para compensar a las personas que puedan haber sufrido daos al recibir ciertas vacunas.  Aquellas personas que consideren que han sufrido un dao como consecuencia de una vacuna y quieren saber ms acerca del programa y como presentar Roslynn Amble, [place] llamar al [phone] o visitar su sitio web en [email or web address].  CMO PUEDO OBTENER MS INFORMACIN?   Consulte a su mdico.  Comunquese con el servicio de salud de su localidad o [address].  Comunquese con los Centros para el control y la prevencin de Child psychotherapist for Disease Control and Prevention , CDC).  Llame al [phone] (1-800-CDC-INFO) o  Visite la pgina web de los CDC en [email or web address]. CDC Inactivated Influenza Vaccine Interim VIS (01/21/12)  Document Released: 09/10/2008 Document Revised: 03/08/2012 ExitCare Patient Information 2014 [place], [place].   Vacuna difteria/ttanos (Td) o [place] difteria, ttanos, tos convulsa (Tdap), Lo que debe saber (Tetanus, Diphtheria [Td] or Tetanus, Diphtheria, Pertussis [Tdap] Vaccine, What You Need to Know) PORQU VACUNARSE? El ttanos , la difteria y la tos ferina pueden ser enfermedades graves.  El TTANOS  (trismo) provoca la contraccin dolorosa y rigidez de los msculos, por lo general, en todo el cuerpo.   Puede causar la contraccin de los msculos de la cabeza y el cuello de modo que el enfermo no puede abrir la boca ni tragar., y en algunos casos, tampoco puede respirar.. El ttanos causa la muerte de 1 de cada 5 personas que se infectan. LA DIFTERIA produce la formacin de una membrana gruesa que cubre el fondo de la garganta.  Puede causar problemas respiratorios, parlisis, insuficiencia cardaca, e incluso la muerte. El PERTUSIS (tos [place]) causa ataques de tos intensa que pueden dificultar la respiracin, provocar vmitos e interrumpir el sueo.    Puede causar prdida de peso, incontinencia, fractura de [place], y desmayos por la intensa tos. Hasta de 2 de cada 100 adolescentes y 5 de cada 100 adultos que enferman de tos [place] deben ser hospitalizados o tienen complicaciones como la neumona y la [place]. Estas 3 enfermedades son provocadas por bacterias. La difteria y la tos Benetta Spar se [place] de persona a Social worker. El ttanos ingresa al organismo a travs de cortes, rasguos o heridas. En los [place] ocurran alrededor de 200 000 casos por ao de difteria y tos [place], antes de que existieran las [place], y tambin ocurran cientos de casos de ttanos. Desde la aparicin de las vacunas, el ttanos y la difteria han disminuido en alrededor del 99% y los casos de tos ferina disminuyeron aproximadamente el 92%.  Los nios menores de 6 aos deben recibir la vacuna DTaP para estar protegidos contra estas tres enfermedades. Pero los [employer], los adolescentes y los adultos tambin necesitan proteccin. VACUNAS PARA  ADOLESCENTES Y ADULTOS Vacunas Tdap y Td  Hay dos vacunas disponibles para proteger de estas enfermedades a nios a Glass blower/designer de los 7aos:   La vacuna Td fue utilizada durante muchos aos. Protege contra el ttanos y la difteria.  La vacuna Tdap fue autorizada en 2005. Es la primera vacuna para adolescentes y adultos que protege contra la tos ferina y el ttanos y la difteria. Una dosis de refuerzo de la Td se recomienda cada 10 aos. La Tdap se aplica slo una vez.  QU VACUNA DEBO APLICARME Y CUANDO? Las edades de 7 a 18 aos  Dynegy 11 y los 12 aos se recomienda una dosis de Tdap. Esta dosis puede aplicarse desde los 7 aos en los nios que no han recibido una o ms dosis de DTaP anteriormente.  Los nios y adolescentes que no recibieron todas las dosis programadas de DTaP o DTP a los 7 aos deben completar la serie usando una combinacin de Td y Tdap. Adultos de 19 aos o ms  [employer] adultos deben recibir  una dosis de refuerzo de Td cada 10 aos. Los adultos de menos de 65 aos que nunca hayan recibido la Tdap deben reemplazarla por la siguiente dosis de refuerzo. Los adultos a partir de los 65 aos puedenrecibir una dosis de Tdap.  Los adultos (incluyendo las mujeres que podran quedar embarazadas y los adultos mayores de 65 aos) que tienen contacto cercano con un beb menor de 12 meses deben aplicarse una dosis de Tdap para proteger al beb de la tos [place].  Los trabajadores de la salud que tengan contacto directo con pacientes en hospitales o clnicas deben recibir una dosis de Tdap. Proteccin despus de [place] herida  Es posible que una persona que tenga un corte o quemadura grave necesite una dosis de Td o Tdap para prevenir la infeccin por ttanos. Puede usarse la Tdap en personas que nunca recibieron una dosis. Pero debe usarse la Td, si la Tdap no se encuentra disponible, o para:  Cualquier persona que haya recibido una dosis de Tdap.  Los nios [employer] 7 y los 9 aos que han [employer].H. [employer] Worldwide series de DTap anteriormente.  Adultos de 65 aos o ms. Mujeres embarazadas.   Las mujeres embarazadas que nunca recibieron una dosis de Ddap deben recibirla despus de la 20a semana de gestacin y preferiblemente durante Contractor. trimestre. Si no se aplican la Tdap durante el embarazo, deben recibirla lo antes posible despus del parto. Las mujeres embarazadas que han recibido la Tdap y tienen que aplicarse la vacuna contra el ttanos o la difteria durante el [place], deben recibir la Td. Las vacunas Tdap y Td pueden ser administradas al mismo tiempo que otras vacunas. ALGUNAS PERSONAS NO DEBEN RECIBIR LA VACUNA O DEBEN [employer]  Las personas que hayan tenido una reaccin alrgica que haya puesto en peligro su vida despus de una dosis de vacuna contra el ttanos, la difteria o la tos ferina no deben recibir Td ni Tdap..  Las personas que tengan alergias graves a algn componente de una vacuna no  deben recibir esa vacuna. Informe a su mdico si la persona que recibe la vacuna sufre alergias graves.  Cualquier persona que American Standard Companies en coma o que haya tenido convulsiones dentro de los 7 [address] posteriores despus de una dosis de DTP o DTaP no debe recibir la Tdap, salvo que se encuentre una causa que no fuera la vacuna. Estas personas pueden recibir Td.  Consulte a su mdico  si la persona que recibe [place] de las vacunas:  Tiene epilepsia o algn otro problema del sistema nervioso.  Tuvo inflamacin o dolor intenso despus de una dosis de DTP, DTaP, DT, Td, o Tdap.  Ha tenido el sndrome de Scientific laboratory technician (GBS por sus siglas en ingls). Las personas que sufran una enfermedad moderada o grave el da en que se programa la vacuna, deben esperar a recuperarse para recibir las vacunas Tdap o Td. Por lo general, una persona con una enfermedad leve o fiebre baja puede recibir la vacuna. CULES SON LOS RIESGOS DE LAS VACUNAS TDAP Y TD? Con Cathleen Corti, al igual que con cualquier Automatic Data, siempre existe un pequeo riesgo de una reaccin alrgica que ponga en peligro la vida o cause otro problema grave. Todo procedimiento mdico, inclusive la vacunacin pueden causar breves episodios de lipotimia o sntomas relacionados (como movimientos espasmdicos). Para evitar los [employer] y las lesiones causadas por las cadas, permanezca sentado o recustese durante los 15 minutos posteriores a la vacunacin. Informe a su mdico si el paciente se siente dbil o mareado, tiene cambios en la visin o siente zumbidos en los odos.  Es mucho ms probable que tener ttanos, difteria, o tos ferina cause problemas ms graves que los provocados por recibir cualquiera de las vacunas Td o Tdap. A continuacin se enumeran los problemas informados despus de las vacunas Td y Tdap. Problemas Leves (perceptibles, pero que no interfirieron con las actividades): Tdap  Dolor (alrededor de 3 de cada 4 adolescentes y 2 de  cada 3 adultos).  Enrojecimiento o inflamacin en el sitio de la inyeccin (alrededor de 1 de cada 5).  Fiebre leve de al menos 100.4 F (38 C) (hasta alrededor de 1 cada 25 adolescentes y 1 de cada 100 adultos).  Dolor de cabeza (alrededor de 4 de cada 10 adolescentes y 3 de cada 10 adultos).  Cansancio (alrededor de 1 de cada 3 adolescentes y 1 de cada 4 adultos).  Nuseas, vmitos, diarrea, o dolor de estmago (hasta 1 de cada 4 adolescentes y 1 de cada 10 adultos).  Escalofros, dolores corporales, dolor articular, erupciones, o inflamacin de las glndulas (poco frecuente). Td  Dolor (hasta alrededor de 8 de cada 10).  Enrojecimiento o inflamacin de la inyeccin (alrededor de 1 de cada 3).  Fiebre leve (hasta alrededor de 1 de cada 5).  Dolor de cabeza o cansancio (poco frecuente). Problemas Moderados (interfieren con las [place], [place] no requieren atencin mdica): Tdap  Dolor en el sitio de la inyeccin (alrededor de 1 de cada 20 adolescentes y 1 de cada 100 adultos).  Enrojecimiento o inflamacin de la inyeccin (alrededor de 1 de cada 16 adolescentes y 1 de cada 25 adultos).  Fiebre de ms de 102 F (38.9 C) (alrededor de 1 de cada 100 adolescentes y 1 de cada 250 adultos).  Dolor de cabeza (1 de cada 300).  Nuseas, vmitos, diarrea, o dolor de estmago (hasta 3 de cada 100 adolescentes y 1 de cada 100 adultos). Td  Fiebre de ms de 102 F (38.9 C) (poco comn). Tdap o Td  Inflamacin de gran extensin en el brazo en el que se aplic la vacuna (hasta 3 de cada 100). Problemas Graves (no puede realizar [employer]; requiere Psychologist, prison and probation services) Tdap o Td  Inflamacin, dolor intenso, sangrado y enrojecimiento en el brazo, en el sitio de la inyeccin (poco frecuente). Puede producirse una reaccin alrgica grave despus de cualquier vacuna. Se estima que estas reacciones ocurren en menos de  una de cada un milln de dosis. QU PASA SI HAY UNA  REACCIN GRAVE? Qu signos debo buscar? Cualquier estado poco habitual, como una reaccin alrgica grave o fiebre alta. Si le produce Runner, broadcasting/film/video grave, se manifestar dentro de algunos minutos a una hora despus de recibir la vacuna. Entre los signos de Automotive engineer grave se encuentran la dificultad para respirar, debilidad, ronquera o sibilancias, latidos cardacos acelerados, urticaria, mareos, palidez, o inflamacin de la garganta. Qu debo hacer?  Comunquese con su mdico o lleve inmediatamente a la persona al mdico.  Dgale a su mdico qu ocurri, la fecha y hora en que sucedi y cundo le aplicaron la vacuna.  Pida a su mdico que informe sobre la reaccin llenando un formulario del Sistema de Informacin de Reacciones Adversos a las Administrator, arts (VAERS, por sus siglas en ingls). O, puede presentar este informe a travs del sitio web de VAERS [email or web address].no o puede llamar al [phone]. VAERS no brinda asistencia mdica. PROGRAMA NACIONAL DE COMPENSACIN DE DAOS POR VACUNAS El [place] de Compensacin de Daos por Vacunas (VICP) fue creado en 1986.  Aquellas personas que consideren que han sufrido un dao como consecuencia de una vacuna y quieren saber ms acerca del programa y como presentar Roslynn Amble, [place] llamar al [phone] o visitar su sitio web en [email or web address]  CMO Roxan Diesel MS INFORMACIN?  El profesional podr darle el prospecto de la vacuna o sugerirle otras fuentes de informacin.  Comunquese con el servicio de salud de su localidad o [address].  Comunquese con los Centros para el control y la prevencin de Child psychotherapist for Disease Control and Prevention , CDC).  Llame al [phone] (1-800-CDC-INFO).  Visite los sitios web de Energy Transfer Partners en [email or web address].uy CDC Td and Tdap Interim VIS-Spanish (07/21/10) Document Released: 09/30/2008 Document Revised: 09/06/2011 ExitCare Patient  Information 2014 [place], [place].

## 2013-03-27 ENCOUNTER — Encounter: Payer: Self-pay | Admitting: Obstetrics & Gynecology

## 2013-03-27 LAB — GLUCOSE TOLERANCE, 1 HOUR (50G) W/O FASTING: Glucose, 1 Hour GTT: 130 mg/dL (ref 70–140)

## 2013-04-09 ENCOUNTER — Encounter: Payer: Self-pay | Admitting: Obstetrics & Gynecology

## 2013-04-09 ENCOUNTER — Ambulatory Visit (INDEPENDENT_AMBULATORY_CARE_PROVIDER_SITE_OTHER): Payer: Self-pay | Admitting: Obstetrics & Gynecology

## 2013-04-09 VITALS — BP 105/74 | Wt 181.8 lb

## 2013-04-09 DIAGNOSIS — Z3480 Encounter for supervision of other normal pregnancy, unspecified trimester: Secondary | ICD-10-CM

## 2013-04-09 DIAGNOSIS — O34219 Maternal care for unspecified type scar from previous cesarean delivery: Secondary | ICD-10-CM

## 2013-04-09 NOTE — Progress Notes (Signed)
P-83  Patient has had a cough and terrible sore throat especially at night time.  I recommended otc meds.

## 2013-04-09 NOTE — Progress Notes (Signed)
Routine visit. Good Fm. No problems.

## 2013-04-23 ENCOUNTER — Ambulatory Visit (INDEPENDENT_AMBULATORY_CARE_PROVIDER_SITE_OTHER): Payer: Self-pay | Admitting: Obstetrics & Gynecology

## 2013-04-23 VITALS — BP 109/83 | Wt 182.0 lb

## 2013-04-23 DIAGNOSIS — Z789 Other specified health status: Secondary | ICD-10-CM

## 2013-04-23 DIAGNOSIS — Z3483 Encounter for supervision of other normal pregnancy, third trimester: Secondary | ICD-10-CM

## 2013-04-23 DIAGNOSIS — Z609 Problem related to social environment, unspecified: Secondary | ICD-10-CM

## 2013-04-23 DIAGNOSIS — O34219 Maternal care for unspecified type scar from previous cesarean delivery: Secondary | ICD-10-CM

## 2013-04-23 DIAGNOSIS — Z3480 Encounter for supervision of other normal pregnancy, unspecified trimester: Secondary | ICD-10-CM

## 2013-04-23 NOTE — Progress Notes (Signed)
Patient is Spanish-speaking only, Spanish interpreter present for this encounter.  Large for dates, patient advised to get growth and AFI check at Dr. Elsie Stain office.  Pelvic cultures next visit. No other complaints or concerns.  Fetal movement and labor precautions reviewed.

## 2013-04-23 NOTE — Progress Notes (Signed)
P = 76 

## 2013-04-23 NOTE — Patient Instructions (Signed)
Return to clinic for any obstetric concerns or go to [facility] for evaluation  

## 2013-05-01 ENCOUNTER — Other Ambulatory Visit (INDEPENDENT_AMBULATORY_CARE_PROVIDER_SITE_OTHER): Payer: Self-pay

## 2013-05-01 DIAGNOSIS — O3660X0 Maternal care for excessive fetal growth, unspecified trimester, not applicable or unspecified: Secondary | ICD-10-CM

## 2013-05-01 DIAGNOSIS — O368190 Decreased fetal movements, unspecified trimester, not applicable or unspecified: Secondary | ICD-10-CM

## 2013-05-01 DIAGNOSIS — O3663X1 Maternal care for excessive fetal growth, third trimester, fetus 1: Secondary | ICD-10-CM

## 2013-05-01 NOTE — Addendum Note (Signed)
Addended by: Barbara Cower on: 05/01/2013 09:21 AM   Modules accepted: Orders

## 2013-05-01 NOTE — Progress Notes (Signed)
Patient is here today for fasting blood sugar.  She also states that last night the baby was not as active as usual.  She was put on the NST and found to be reactive.  She is otherwise feeling well and will follow up next week at her regular scheduled appointment.

## 2013-05-07 ENCOUNTER — Ambulatory Visit (INDEPENDENT_AMBULATORY_CARE_PROVIDER_SITE_OTHER): Payer: Self-pay | Admitting: Obstetrics & Gynecology

## 2013-05-07 VITALS — BP 105/66 | Wt 186.0 lb

## 2013-05-07 DIAGNOSIS — Z3480 Encounter for supervision of other normal pregnancy, unspecified trimester: Secondary | ICD-10-CM

## 2013-05-07 DIAGNOSIS — O34219 Maternal care for unspecified type scar from previous cesarean delivery: Secondary | ICD-10-CM

## 2013-05-07 NOTE — Progress Notes (Signed)
Patient is Spanish-speaking only, Spanish interpreter present for this encounter.  Pelvic cultures done. 04/30/13 scan at Dr. Elsie Stain office showed EFW 8 lbs and normal AFI.  RLTCS scheduled at 39 weeks; 04/24/13.  No other complaints or concerns.  Fetal movement and labor precautions reviewed.

## 2013-05-07 NOTE — Progress Notes (Signed)
P - 97 

## 2013-05-07 NOTE — Patient Instructions (Signed)
Regrese a la clinica cuando tenga su cita. Si tiene problemas o preguntas, llama a la clinica o vaya a la sala [facility] emerg[facility] al [facility].    

## 2013-05-08 LAB — GC/CHLAMYDIA PROBE AMP: CT Probe RNA: NEGATIVE

## 2013-05-11 ENCOUNTER — Encounter: Payer: Self-pay | Admitting: Obstetrics & Gynecology

## 2013-05-11 LAB — CULTURE, BETA STREP (GROUP B ONLY)

## 2013-05-14 ENCOUNTER — Ambulatory Visit (INDEPENDENT_AMBULATORY_CARE_PROVIDER_SITE_OTHER): Payer: Self-pay | Admitting: Obstetrics & Gynecology

## 2013-05-14 VITALS — BP 106/76 | Wt 189.0 lb

## 2013-05-14 DIAGNOSIS — Z3483 Encounter for supervision of other normal pregnancy, third trimester: Secondary | ICD-10-CM

## 2013-05-14 DIAGNOSIS — O34219 Maternal care for unspecified type scar from previous cesarean delivery: Secondary | ICD-10-CM

## 2013-05-14 DIAGNOSIS — Z789 Other specified health status: Secondary | ICD-10-CM

## 2013-05-14 DIAGNOSIS — Z609 Problem related to social environment, unspecified: Secondary | ICD-10-CM

## 2013-05-14 DIAGNOSIS — Z3480 Encounter for supervision of other normal pregnancy, unspecified trimester: Secondary | ICD-10-CM

## 2013-05-14 NOTE — Patient Instructions (Addendum)
Regrese a la clinica cuando tenga su cita. Si tiene problemas o preguntas, llama a la clinica o vaya a la sala de emergencia al [employer].  Informacin sobre Engineer, civil (consulting)  (Sterilization Information, Female) La esterilizacin en la mujer es un procedimiento que se realiza para Location manager de [place]. Hay diferentes formas de Futures trader, Biomedical engineer en todos los casos se bloquean o se cierran las trompas de Falopio para que vulos no puedan llegar al tero. Si el vulo no llega al tero, los espermatozoides no fertilizan el vulo, y usted no podr [place].  La esterilizacin se lleva a cabo por medio de un procedimiento quirrgico. A veces, estos procedimientos se realizan en el hospital haciendo dormir a Education officer, community. En otros casos se realiza en un consultorio en una clnica y la paciente permanece despierta. Las trompas de [employer] se pueden cortar, Public affairs consultant o sellar quirrgicamente, con un procedimiento que se llama ligadura de trompas. Otro mtodo es cerrarlas con clips o anillos. La esterilizacin tambin puede realizarse mediante la colocacin de un pequeo resorte en cada trompa de [place], lo que hace que se desarrolle tejido cicatrizal en el interior de la trompa. Luego el tejido [employer].  Comente el tema con su mdico para responder las inquietudes que usted o su pareja puedan Warehouse manager. Podr preguntarle a su mdico qu tipo de Diplomatic Services operational officer. Algunos profesionales pueden no realizar todas las opciones. La esterilizacin es permanente y slo debe hacerse si est segura de que no desea tener hijos o no desea tener ms hijos. La reversin de la esterilizacin puede no tener xito.  PROCEDIMIENTOS PARA LA ESTERILIZACIN   Esterilizacin laparoscpica. Es un mtodo quirrgico que se Biomedical engineer en otro momento que no es inmediatamente despus del [place]. Se realizan dos incisiones en la zona baja del abdomen. Un  tubo delgado con una fuente de luz (laparoscopio) se inserta en una de las incisiones y se [place] para Surveyor, quantity. Las trompas de [place]opio se cierran con un anillo o un clip. Podrn utilizar un instrumento que utiliza el calor para sellar las trompas (electrocauterizacin)..   Mini-laparotoma. Es un mtodo quirrgico que se Biomedical engineer 1 o 2 das despus del [place]. En general, consiste en una pequea incisin que se hace justo debajo del (ombligo) por el cual se visualizan las trompas de [place]. Las trompas pueden ser selladas, atadas o cortadas.   Esterilizacin histeroscpica. Esto se realiza en otro momento que no sea inmediatamente despus del parto. Se coloca un pequeo resorte helicoidal a travs del cuello y del cuerpo del tero y se inserta en las trompas de [place]. El resorte produce cicatrizacin y [employer] trompas. Se deben utilizar otras formas de anticoncepcin durante 3 meses despus del procedimiento para permitir que el tejido cicatrizal se forme completamente. Adems, es necesario hacer una histerosalpingografa despus de 3 meses para asegurarse de que el procedimiento ha sido exitoso.  La histerosalpingografa es un procedimiento en el que utilizan rayos X para observar el tero y las trompas de Falopio despus de insertar un dispositivo, para asegurarse de que est bien colocado. LA ESTERILIZACIN ES UN PROCEDIMIENTO SEGURO?  La esterilizacin se considera un procedimiento seguro en el que rara vez se producen complicaciones. Los riesgos dependen del tipo de procedimiento que se realicen. Al igual que con cualquier procedimiento quirrgico, puede haber riesgos. Algunos son:   Heron Nay.  Infeccin.  Reaccin a la anestesia.  Lesin en los rganos circundantes. Los  riesgos especficos de Education officer, community de los resortes por histeroscopa son:   No se Production designer, theatre/television/film.   Las resortes pueden salirse del Environmental consultant.   Las trompas no  quedan completamente bloqueadas despus de 3 meses.   Ocurre una lesin en los rganos adyacentes al colocarlo.  LA ESTERILIZACIN ES UN MTODO EFECTIVO?  La esterilizacin es efectiva en casi 100% , pero puede fallar. Segn el tipo de esterilizacin, el porcentaje de fracaso puede ser tan alto como en un 3%. Despus de la esterilizacin histeroscpica con colocacin de un resorte en las trompas de [place], Pension scheme manager un mtodo anticonceptivo de respaldo durante 3 meses. La esterilizacin es efectiva durante toda la vida.  LOS BENEFICIOS DE LA ESTERILIZACIN   No afecta las hormonas, y por lo tanto no afectar sus perodos menstruales, el deseo o el rendimiento sexual, .   Es efectiva para toda la vida.   Es segura.   No tiene que preocuparse por Location manager. Tenga en cuenta que si fue sometida a este procedimiento, debe esperar 3 meses (o hasta que el mdico lo confirme) antes de considerar que no quedar embarazada.   No hay efectos secundarios a diferencia de otros tipos de control de la natalidad (contracepcin).  INCONVENIENTES DE LA ESTERILIZACIN   Usted debe estar seguro de que no desea tener hijos o ms hijos. El procedimiento es [place].   No ofrece proteccin contra las infecciones de transmisin sexual (ITS).   Las trompas [employer] a Engineer, building services. Si esto ocurre, habr riesgo de embarazo. Tambin hay un mayor riesgo (50%) que el embarazo sea ectpico. Se llama as al embarazo que ocurre fuera del tero. Document Released: 12/01/2007 Document Revised: 12/14/2011 [facility] Patient Information 2014 [place], [place].  Eleccin del mtodo anticonceptivo (Contraception Choices) La anticoncepcin (control de la natalidad) es el uso de cualquier mtodo o dispositivo para Location manager. A continuacin se indican algunos de esos mtodos. MTODOS HORMONALES   El Implante contraconceptivo consiste en un tubo plstico delgado que contiene la hormona  progesterona. No contiene estrgenos. El mdico inserta el tubo en la parte interna del brazo. El tubo puede Geneticist, molecular durante 3 aos. Despus de los 3 aos debe retirarse. El implante impide que los ovarios liberen vulos (ovulacin), espesa el moco cervical, lo que evita que los espermatozoides ingresen al tero y hace ms delgada la membrana que cubre el interior del tero.  Inyecciones de progesterona sola: las Insurance underwriter cada 3 meses para Location manager. La progesterona sinttica impide que los ovarios liberen vulos. Tambin hacen que el moco cervical se espese y modifique el tejido de recubrimiento interno del tero. Esto hace ms difcil que los espermatozoides sobrevivan en el tero.  Las pldoras anticonceptivas contienen estrgenos y Education officer, museum. Su funcin es [employer] ovarios liberen vulos (ovulacin). Las hormonas de los anticonceptivos orales hacen que el moco cervical se haga ms espeso, lo que evita que el esperma ingrese al tero. Las pldoras anticonceptivas son recetadas por el mdico.Tambin se utilizan para tratar los perodos menstruales abundantes.  Minipldora: este tipo de pldora anticonceptiva contiene slo hormona progesterona. Deben tomarse todos los [address] del mes y debe recetarlas el mdico.  El parche de control de natalidad: contiene hormonas similares a las que contienen las pldoras anticonceptivas. Deben cambiarse una vez por semana y se utilizan bajo prescripcin mdica.  Anillo vaginal: contiene hormonas similares a las que contienen las pldoras anticonceptivas. Se deja colocado durante tres semanas, se  lo retira durante 1 semana y luego se coloca uno nuevo. La paciente debe sentirse cmoda al insertar y retirar el anillo de la vagina.Es necesaria la prescripcin mdica.  Anticonceptivos de emergencia: son mtodos para evitar un embarazo despus de Neomia Dear relacin sexual sin proteccin. Esta pldora puede tomarse inmediatamente  despus de Child psychotherapist sexuales o hasta 5 Cash de haber tenido sexo sin proteccin. Es ms efectiva si se toma poco tiempo despus de la relacin sexual. Los anticonceptivos de emergencia estn disponibles sin prescripcin mdica. Consltelo con su farmacutico. No use los anticonceptivos de emergencia como nico mtodo anticonceptivo. MTODOS DE Lenis Noon   Condn masculino: es una vaina delgada (ltex o goma) que se coloca cubriendo al pene durante el acto sexual. Deri Fuelling con espermicida para aumentar la efectividad.  Condn femenino. Es una funda delicada y blanda que se adapta holgadamente a la vagina antes de las Clinical research associate.  Diafragma: es una barrera de ltex redonda y suave que debe ser recomendado por un profesional. Se inserta en la vagina, junto con un gel espermicida. Debe insertarse antes de Management consultant. Debe dejar el diafragma colocado en la vagina durante 6 a 8 horas despus de la relacin sexual.  Capuchn cervical: es una barrera de ltex o taza plstica redonda y [place] que cubre el cuello del tero y debe ser colocada por un mdico. Puede dejarlo colocado en la vagina hasta 48 horas despus de las Clinical research associate.  Esponja: es una pieza blanda y circular de espuma de poliuretano. Contiene un espermicida. Se inserta en la vagina despus de mojarla y antes de las [employer].  Espermicidas: son sustancias qumicas que matan o bloquean al esperma y no lo dejan ingresar al cuello del tero y al tero. Vienen en forma de cremas, geles, supositorios, espuma o comprimidos. No es necesario tener Emergency planning/management officer. Se insertan en la vagina con un aplicador antes de Management consultant. El proceso debe repetirse cada vez que tiene relaciones sexuales. ANTICONCEPTIVOS INTRAUTERINOS  Dispositivo intrauterino (DIU) es un dispositivo en forma de T que se coloca en el tero durante el perodo menstrual, para Location manager. Hay dos tipos:  DIU de  cobre: este tipo de DIU est recubierto con un alambre de cobre y se inserta dentro del tero. El cobre hace que el tero y las trompas de Falopio produzcan un liquido que [employer] espermatozoides. Puede permanecer colocado durante 10 aos.  DIU con hormona: este tipo de DIU contiene la hormona progestina (progesterona sinttica). La hormona espesa el moco cervical y evita que los espermatozoides ingresen al tero y tambin afina la membrana que cubre el tero para evitar la implantacin del vulo fertilizado. La hormona debilita o destruye los espermatozoides que ingresan al tero. Puede Geneticist, molecular durante 3 5 aos, segn el tipo de DIU que se utilice. MTODOS ANTICONCEPTIVOS PERMANENTES  Ligadura de trompas en la mujer: se realiza sellando, atando u obstruyendo quirrgicamente las trompas de Falopio lo que impide que el vulo descienda hacia el tero.  Esterilizacin histeroscpica: Implica la colocacin de un pequeo espiral o la insercin en cada trompa de Falopio. El mdico utiliza una tcnica llamada histeroscopa para Primary school teacher procedimiento. El dispositivo produce la formacin de tejido Designer, television/film set. Esto da como resultado una obstruccin permanente de las trompas de [place]op[place], de modo que la esperma no pueda fertilizar el vulo. Demora alrededor de 3 meses despus del procedimiento hasta que el conducto se obstruye. Tendr que usar otro mtodo anticonceptivo durante al  menos 3 meses.  Esterilizacin masculina: se realiza ligando los conductos por los que pasan los espermatozoides (vasectoma).Esto impide que el esperma ingrese a la vagina durante el acto sexual. Luego del procedimiento, el hombre puede eyacular lquido (semen). MTODOS DE PLANIFICACIN NATURAL  Planificacin familiar natural: consiste en no Management consultant o usar un mtodo de barrera (condn, [place], capuchn cervical) en los [employer] la mujer podra quedar [place].  Mtodo de calendario:  consiste en el seguimiento de la duracin de cada ciclo menstrual y la identificacin de los perodos frtiles.  Mtodo de ovulacin: Paramedic las relaciones sexuales durante la ovulacin.  Mtodo sintotrmico: Advertising copywriter sexuales en la poca en la que se est ovulando, utilizando un termmetro y tendiendo en cuenta los sntomas de la ovulacin.  Mtodo postovulacin: Youth worker las relaciones sexuales para despus de haber ovulado. Independientemente del tipo o mtodo anticonceptivo que usted elija, es importante que use condones para protegerse contra las infecciones de transmisin sexual (ETS). Hable con su mdico con respecto a qu mtodo anticonceptivo es el ms apropiado para usted. Document Released: 06/14/2005 Document Revised: 02/14/2013 [facility] Patient Information 2014 [place], [place].

## 2013-05-14 NOTE — Progress Notes (Signed)
Patient is Spanish-speaking only, Spanish interpreter present for this encounter.  RLTCS at 39 weeks.  Unsure about contraception, detailed counseling done.  No other complaints or concerns.  Fetal movement and labor precautions reviewed.

## 2013-05-14 NOTE — Progress Notes (Signed)
[id number] 

## 2013-05-21 ENCOUNTER — Ambulatory Visit (INDEPENDENT_AMBULATORY_CARE_PROVIDER_SITE_OTHER): Payer: Self-pay | Admitting: Family Medicine

## 2013-05-21 VITALS — BP 133/86 | Wt 188.0 lb

## 2013-05-21 DIAGNOSIS — O34219 Maternal care for unspecified type scar from previous cesarean delivery: Secondary | ICD-10-CM

## 2013-05-21 DIAGNOSIS — Z3480 Encounter for supervision of other normal pregnancy, unspecified trimester: Secondary | ICD-10-CM

## 2013-05-21 NOTE — Patient Instructions (Signed)
Tercer trimestre del embarazo  (Third Trimester of Pregnancy) El tercer trimestre del embarazo abarca desde la semana 29 hasta la semana 42, desde el 7 mes hasta el 9. En este trimestre el feto se desarrolla muy rpidamente. Hacia el final del noveno mes, el beb que an no ha nacido mide alrededor de 20 pulgadas (45 cm) de largo y pesa entre 6 y 10 libras (2,700 y 4,500 kg).  CAMBIOS CORPORALES  Su organismo atravesar numerosos cambios durante el embarazo. Los cambios varan de una mujer a otra.   Seguir aumentando de peso. Es esperable que aumente entre 25 y 35 libras (11 16 kg) hacia el final del embarazo.  Podrn aparecer las primeras estras en las caderas, abdomen y mamas.  Tendr necesidad de orinar con ms frecuencia porque el feto baja hacia la pelvis y presiona en la vejiga.  Como consecuencia del embarazo, podr sentir acidez estomacal continuamente.  Podr estar constipada ya que ciertas hormonas hacen que los msculos que hacen progresar los desechos a travs de los intestinos trabajen ms lentamente.  Pueden aparecer hemorroides o abultarse e hincharse las venas (venas varicosas).  Podr sentir dolor plvico debido al aumento de peso ya que las hormonas del embarazo relajan las articulaciones entre los huesos de la pelvis. El dolor de espalda puede ser consecuencia de la exigencia de los msculos que soportan la postura.  Sus mamas seguirn desarrollndose y estarn ms sensibles. A veces sale una secrecin amarilla de las mamas, que se llama calostro.  El ombligo puede salir hacia afuera.  Podr sentir que le falta el aire debido a que se expande el tero.  Podr notar que el feto "baja" o que se siente ms bajo en el abdomen.  Podr tener una prdida de secrecin mucosa con sangre. Esto suele ocurrir entre unos pocos das y una semana antes del parto.  El cuello se vuelve delgado y blando (se borra) cerca de la fecha de parto. QU DEBE ESPERAR EN LAS CONSULTAS  PRENATALES  Le harn exmenes prenatales cada 2 semanas hasta la semana 36. A partir de ese momento le harn exmenes semanales. Durante una visita prenatal de rutina:   La pesarn para verificar que usted y el feto se encuentran dentro de los lmites normales.  Le tomarn la presin arterial.  Le medirn el abdomen para verificar el desarrollo del beb.  Escucharn los latidos fetales.  Se evaluarn los resultados de los estudios solicitados en visitas anteriores.  Le controlarn el cuello del tero cuando est prxima la fecha de parto para ver si se ha borrado. Alrededor de la semana 36 el mdico controlar el cuello del tero. Al mismo tiempo realizar un anlisis de las secreciones del tejido vaginal. Este examen es para determinar si hay un tipo de bacteria, estreptococo Grupo B. El mdico le explicar esto con ms detalle.  El mdico podr preguntarle:   Como le gustara que fuera el parto.  Cmo se siente.  Si siente los movimientos del beb.  Si tiene sntomas anormales, como prdida de lquido, sangrado, dolores de cabeza intenso o clicos abdominales.  Si tiene alguna duda. Otros estudios que podrn realizarse durante el tercer trimestre son:   Anlisis de sangre para controlar sus niveles de hierro (anemia).  Controles fetales para determinar su salud, el nivel de actividad y su desarrollo. Si tiene alguna enfermedad o si tuvo problemas durante el embarazo, le harn estudios. FALSO TRABAJO DE PARTO  Es posible que sienta contracciones pequeas e irregulares que finalmente   desaparecen. Se llaman contracciones de Braxton Hicks o falso trabajo de parto. Las contracciones pueden durar horas, das o an semanas antes de que el verdadero trabajo de parto se inicie. Si las contracciones tienen intervalos regulares, se intensifican o se hacen dolorosas, lo mejor es que la revise su mdico.  SIGNOS DE TRABAJO DE PARTO   Espasmos del tipo menstrual.  Contracciones cada 5  minutos o menos.  Contracciones que comienzan en la parte superior del tero y se expanden hacia abajo, a la zona inferior del abdomen y la espalda.  Sensacin de presin que aumenta en la pelvis o dolor en la espalda.  Aparece una secrecin acuosa o sanguinolenta por la vagina. Si tiene alguno de estos signos antes de la semana 37 del embarazo, llame a su mdico inmediatamente. Debe concurrir al hospital para ser controlada inmediatamente.  INSTRUCCIONES PARA EL CUIDADO EN EL HOGAR   Evite fumar, consumir hierbas, beber alcohol y utilizar frmacos que no le hayan recetado. Estas sustancias qumicas afectan la formacin y el desarrollo del beb.  Siga las indicaciones del profesional con respecto a como tomar los medicamentos. Durante el embarazo, hay medicamentos que son seguros y otros no lo son.  Realice actividad fsica slo segn las indicaciones del mdico. Sentir clicos uterinos es el mejor signo para detener la actividad fsica.  Contine haciendo comidas regulares y sanas.  Use un sostn que le brinde buen soporte si sus mamas estn sensibles.  No utilice la baera con agua caliente, baos turcos o saunas.  Colquese el cinturn de seguridad cuando conduzca.  Evite comer carne cruda queso sin cocinar y el contacto con los utensilios y desperdicios de los gatos. Estos elementos contienen grmenes que pueden causar defectos de nacimiento en el beb.  Tome las vitaminas indicadas para la etapa prenatal.  Pruebe un laxante (si el mdico la autoriza) si tiene constipacin. Consuma ms alimentos ricos en fibra, como vegetales y frutas frescos y cereales enteros. Beba gran cantidad de lquido para mantener la orina de tono claro o amarillo plido.  Tome baos de agua tibia para calmar el dolor o las molestias causadas por las hemorroides. Use una crema para las hemorroides si el mdico la autoriza.  Si tiene venas varicosas, use medias de soporte. Eleve los pies durante 15 minutos,  3 4 veces por da. Limite el consumo de sal en su dieta.  Evite levantar objetos pesados, use zapatos de tacones bajos y mantenga una buena postura.  Descanse con las piernas elevadas si tiene calambres o dolor de cintura.  Visite a su dentista si no lo ha hecho durante el embarazo. Use un cepillo de dientes blando para higienizarse los dientes y use suavemente el hilo dental.  Puede continuar su vida sexual excepto que el mdico le indique otra cosa.  No haga viajes largos excepto que sea absolutamente necesario y slo con la aprobacin de su mdico.  Tome clases prenatales para entender, practicar y hacer preguntas sobre el trabajo de parto y el alumbramiento.  Haga un ensayo sobre la partida al hospital.  Prepare el bolso que llevar al hospital.  Prepare la habitacin del beb.  Contine concurriendo a todas las visitas prenatales segn las indicaciones de su mdico. SOLICITE ATENCIN MDICA SI:   No est segura si est en trabajo de parto o ha roto la bolsa de aguas.  Tiene mareos.  Siente clicos leves, presin en la pelvis o dolor persistente en el abdomen.  Tiene nuseas o vmitos persistentes.  Observa una   secrecin vaginal con mal olor.  Siente dolor al orinar. SOLICITE ATENCIN MDICA DE INMEDIATO SI:   Tiene fiebre.  Pierde lquido o sangre por la vagina.  Tiene sangrado o pequeas prdidas vaginales.  Siente dolor intenso o clicos en el abdomen.  Sube o baja de peso rpidamente.  Le falta el aire y le duele el pecho al respirar.  Sbitamente se le hincha el rostro, las manos, los tobillos, los pies o las piernas de manera extrema.  No ha sentido los movimientos del beb durante una hora.  Siente un dolor de cabeza intenso que no se alivia con medicamentos.  Su visin se modifica. Document Released: 03/24/2005 Document Revised: 02/14/2013 [employer] Patient Information 2014 [employer].  Lactancia materna  (Breastfeeding)  El cambio hormonal  durante el embarazo produce el desarrollo del tejido mamario y un aumento en el nmero y tamao de los conductos galactforos. La hormona prolactina permite que las protenas, los azcares y las grasas de la sangre produzcan la leche materna en las glndulas productoras de leche. La hormona progesterona impide que la leche materna sea liberada antes del nacimiento del beb. Despus del nacimiento del beb, su nivel de progesterona disminuye permitiendo que la leche materna sea liberada. Pensar en el beb, as como la succin o el llanto, pueden estimular la liberacin de leche de las glndulas productoras de leche.  La decisin de amamantar (lactar) es una de las mejores opciones que usted puede hacer para usted y su beb. La informacin que sigue da una breve resea de los beneficios, as como otras caractersticas importantes que debe saber sobre la lactancia materna.  LOS BENEFICIOS DE AMAMANTAR  Para el beb   La primera leche (calostro) ayuda al mejor funcionamiento del sistema digestivo del beb.   La leche tiene anticuerpos que provienen de la madre y que ayudan a prevenir las infecciones en el beb.   El beb tiene una menor incidencia de asma, alergias y del sndrome de muerte sbita del lactante (SMSL).   Los nutrientes de la leche materna son mejores para el beb que la leche maternizada.  La leche materna mejora el desarrollo cerebral del beb.   Su beb tendr menos gases, clicos y estreimiento.  Es menos probable que el beb desarrolle otras enfermedades, como obesidad infantil, asma o diabetes mellitus. Para usted   La lactancia materna favorece el desarrollo de un vnculo muy especial entre la madre y el beb.   Es ms conveniente, siempre disponible, a la temperatura adecuada y econmica.   La lactancia materna ayuda a quemar caloras y a perder el peso ganado durante el embarazo.   Hace que el tero se contraiga ms rpidamente a su tamao normal y disminuye el  sangrado despus del parto.   Las madres que amamantan tienen menos riesgo de desarrollar osteoporosis o cncer de mama o de ovario en el futuro.  FRECUENCIA DEL AMAMANTAMIENTO   Un beb sano, nacido a trmino, puede amamantarse con tanta frecuencia como cada hora, o espaciar las comidas cada tres horas. La frecuencia en la lactancia varan de un beb a otro.   Los recin nacidos deben ser alimentados por lo menos cada 2-3 horas durante el da y cada 4-5 horas durante la noche. Usted debe amamantarlo un mnimo de 8 tomas en un perodo de 24 horas.  Despierte al beb para amamantarlo si han pasado 3-4 horas desde la ltima comida.  Amamante cuando sienta la necesidad de reducir la plenitud de sus senos o cuando el   beb muestre signos de hambre. Las seales de que el beb puede tener hambre son:  Aumenta su estado de alerta o vigilancia.  Se estira.  Mueve la cabeza de un lado a otro.  Mueve la cabeza y abre la boca cuando se le toca la mejilla o la boca (reflejo de succin).  Aumenta las vocalizaciones, tales como sonidos de succin, relamerse los labios, arrullos, suspiros, o chirridos.  Mueve la mano hacia la boca.  Se chupa con ganas los dedos o las manos.  Agitacin.  Llanto intermitente.  Los signos de hambre extrema requerirn que lo calme y lo consuele antes de tratar de alimentarlo. Los signos de hambre extrema son:  Agitacin.  Llanto fuerte e intenso.  Gritos.  El amamantamiento frecuente la ayudar a producir ms leche y a prevenir problemas de dolor en los pezones e hinchazn de las mamas.  LACTANCIA MATERNA   Ya sea que se encuentre acostada o sentada, asegrese que el abdomen del beb est enfrente el suyo.   Sostenga la mama con el pulgar por arriba y los otros 4 dedos por debajo del pezn. Asegrese que sus dedos se encuentren lejos del pezn y de la boca del beb.   Empuje suavemente los labios del beb con el pezn o con el dedo.   Cuando la  boca del beb se abra lo suficiente, introduzca el pezn y la zona oscura que lo rodea (areola) tanto como le sea posible dentro de la boca.  Debe haber ms areola visible por arriba del labio superior que por debajo del labio inferior.  La lengua del beb debe estar entre la enca inferior y el seno.  Asegrese de que la boca del beb est en la posicin correcta alrededor del pezn (prendida). Los labios del beb deben crear un sello sobre su pecho.  Las seales de que el beb se ha prendido eficazmente al pezn son:  Tironea o succiona sin dolor.  Se escucha que traga entre las succiones.  No hace ruidos ni chasquidos.  Hay movimientos musculares por arriba y por delante de sus odos al succionar.  El beb debe succionar unos 2-3 minutos para que salga la leche. Permita que el nio se alimente en cada mama todo lo que desee. Alimente al beb hasta que se desprenda o se quede dormido en el primer pecho y luego ofrzcale el segundo pecho.  Las seales de que el beb est lleno y satisfecho son:  Disminuye gradualmente el nmero de succiones o no succiona.  Se queda dormido.  Extiende o relaja su cuerpo.  Retiene una pequea cantidad de leche en la boca.  Se desprende del pecho por s mismo.  Los signos de una lactancia materna eficaz son:  Los senos han aumentado la firmeza, el peso y el tamao antes de la alimentacin.  Son ms blandos despus de amamantar.  Un aumento del volumen de leche, y tambin el cambio de su consistencia y color se producen hacia el quinto da de lactancia materna.  La congestin mamaria se alivia al dar de mamar.  Los pezones no duelen, ni estn agrietados ni sangran.  De ser necesario, interrumpa la succin poniendo su dedo en la esquina de la boca del beb y deslizando el dedo entre sus encas. A continuacin, retire la mama de su boca.  Es comn que los bebs regurgiten un poco despus de comer.  A menudo los bebs tragan aire al  alimentarse. Esto puede hacer que se sienta molesto. Hacer eructar al   beb al cambiar de pecho puede ser de ayuda.  Se recomiendan suplementos de vitamina D para los bebs que reciben slo leche materna.  Evite el uso del chupete durante las primeras 4 a 6 semanas de vida.  Evite la alimentacin suplementaria con agua, frmula o jugo en lugar de la leche materna. La leche materna es todo el alimento que el beb necesita. No es necesario que el nio ingiera agua o preparados de bibern. Sus pechos producirn ms leche si se evita la alimentacin suplementaria durante las primeras semanas. COMO SABER SI EL BEB OBTIENE LA SUFICIENTE LECHE MATERNA  Preguntarse si el beb obtiene la cantidad suficiente de leche es una preocupacin frecuente entre las madres. Puede asegurarse que el beb tiene la leche suficiente si:   El beb succiona activamente y usted escucha que traga.   El beb parece estar relajado y satisfecho despus de mamar.   El nio se alimenta al menos 8 a 12 veces en 24 horas.  Durante los primeros 3 a 5 das de vida:  Moja 3-5 paales en 24 horas. La materia fecal debe ser blanda y amarillenta.  Tiene al menos 3 a 4 deposiciones en 24 horas. La materia fecal debe ser blanda y amarillenta.  A los 5-7 das de vida, el beb debe tener al menos 3-6 deposiciones en 24 horas. La materia fecal debe ser grumosa y amarilla a los 5 das de vida.  Su beb tiene una prdida de peso menor a 7al 10% durante los primeros 3 das de vida.  El beb no pierde peso despus de 3-7 das de vida.  El beb debe aumentar 4 a 6 libras (120 a 170 gr.) por semana despus de los 4 das de vida.  Aumenta de peso a los 5 das de vida y vuelve al peso del nacimiento dentro de las 2 semanas. CONGESTIN MAMARIA  Durante la primera semana despus del parto, usted puede experimentar hinchazn en las mamas (congestin mamaria). Al estar congestionadas, las mamas se sienten pesadas, calientes o sensibles al  tacto. El pico de la congestin ocurre a las 24 -48 horas despus del parto.   La congestin puede disminuirse:  Continuando con la lactancia materna.  Aumentando la frecuencia.  Tomando duchas calientes o aplicando calor hmedo en los senos antes de cada comida. Esto aumenta la circulacin y ayuda a que la leche fluya.   Masajeando suavemente el pecho antes y durante las comidas. Con las yemas de los dedos, masajee la pared del pecho hacia el pezn en un movimiento circular.   Asegurarse de que el beb vaca al menos uno de sus pechos en cada alimentacin. Tambin ayuda si comienza la siguiente toma en el otro seno.   Extraiga manualmente o con un sacaleches las mamas para vaciar los pechos si el beb tiene sueo o no se aliment bien. Tambin puede extraer la leche cuando vuelva a trabajar o si siente que se estn congestionando las mamas.  Asegrese de que el beb se prende y est bien colocado durante la lactancia. Si sigue estas indicaciones, la congestin debe mejorar en 24 a 48 horas. Si an tiene dificultades, consulte a su asesor en lactancia.  CUDESE USTED MISMA  Cuide sus mamas.   Bese o dchese diariamente.   Evite usar jabn en los pezones.   Use un sostn de soporte Evite el uso de sostenes con aro.  Seque al aire sus pezones durante 3-4 minutos despus de cada comida.   Utilice slo apsitos de   algodn en el sostn para absorber las prdidas de leche. La prdida de un poco de leche materna entre las comidas es normal.   Use solamente lanolina pura en sus pezones despus de amamantar. Usted no tiene que lavarla antes de alimentar al beb. Otra opcin es sacarse unas gotas de leche y masajear suavemente los pezones.  Continuar con los autocontroles de la mama. Cudese.   Consuma alimentos saludables. Alterne 3 comidas con 3 colaciones.  Evite los alimentos que usted nota que perjudican al beb.  Beba leche, jugos de fruta y agua para satisfacer su sed  (aproximadamente 8 vasos al da).   Descanse con frecuencia, reljese y tome sus vitaminas prenatales para evitar la fatiga, el estrs y la anemia.  Evite masticar y fumar tabaco.  Evite el consumo de alcohol y drogas.  Tome medicamentos de venta libre y recetados tal como le indic su mdico o farmacutico. Siempre debe consultar con su mdico o farmacutico antes de tomar cualquier medicamento, vitamina o suplemento de hierbas.  Sepa que durante la lactancia puede quedar embarazada. Si lo desea, hable con su mdico acerca de la planificacin familiar y los mtodos anticonceptivos seguros que puede utilizar durante la lactancia. SOLICITE ATENCIN MDICA SI:   Usted siente que quiere dejar de amamantar o se siente frustrada con la lactancia.  Siente dolor en los senos o en los pezones.  Sus pezones estn agrietados o sangran.  Sus pechos estn irritados, sensibles o calientes.  Tiene un rea hinchada en cualquiera de los senos.  Siente escalofros o fiebre.  Tiene nuseas o vmitos.  Observa un drenaje en los pezones.  Sus mamas no se llenan antes de amamantarlo al 5to da despus del parto.  Se siente triste y deprimida.  El nio est demasiado somnoliento como para comer.  El nio tiene problemas para respirar.   Moja menos de 3 paales en 24 horas.  Mueve el intestino menos de 3 veces en 24 horas.  La piel del beb o la parte blanca de sus ojos est ms amarilla.   El beb no ha aumentado de peso a los 5 das de vida. ASEGRESE DE QUE:   Comprende estas instrucciones.  Controlar su enfermedad.  Solicitar ayuda de inmediato si no mejora o si empeora. Document Released: 06/14/2005 Document Revised: 03/08/2012 [employer] Patient Information 2014 [employer].  

## 2013-05-21 NOTE — Assessment & Plan Note (Signed)
Continue routine prenatal care.  

## 2013-05-21 NOTE — Progress Notes (Signed)
P = 91 

## 2013-05-21 NOTE — Progress Notes (Signed)
Has C-section scheduled. Does not want BTS at this point.

## 2013-05-23 ENCOUNTER — Encounter (HOSPITAL_COMMUNITY): Payer: Self-pay

## 2013-05-23 ENCOUNTER — Encounter (HOSPITAL_COMMUNITY)
Admission: RE | Admit: 2013-05-23 | Discharge: 2013-05-23 | Disposition: A | Discharge status: Home / Self Care | Payer: Self-pay | Source: Ambulatory Visit | Attending: Obstetrics and Gynecology | Admitting: Obstetrics and Gynecology

## 2013-05-23 VITALS — BP 106/78 | HR 82 | Temp 97.5°F | Resp 20 | Ht <= 58 in | Wt 185.0 lb

## 2013-05-23 DIAGNOSIS — Z01818 Encounter for other preprocedural examination: Secondary | ICD-10-CM | POA: Insufficient documentation

## 2013-05-23 DIAGNOSIS — Z01812 Encounter for preprocedural laboratory examination: Secondary | ICD-10-CM | POA: Insufficient documentation

## 2013-05-23 DIAGNOSIS — O34219 Maternal care for unspecified type scar from previous cesarean delivery: Secondary | ICD-10-CM

## 2013-05-23 LAB — CBC
HCT: 33.9 % — ABNORMAL LOW (ref 36.0–46.0)
MCHC: 34.5 g/dL (ref 30.0–36.0)
RDW: 13.8 % (ref 11.5–15.5)

## 2013-05-23 LAB — TYPE AND SCREEN: Antibody Screen: NEGATIVE

## 2013-05-23 LAB — ABO/RH: ABO/RH(D): O POS

## 2013-05-23 NOTE — Patient Instructions (Signed)
20 Ana Goodwin  05/23/2013   Your procedure is scheduled on:  05/25/13  Enter through the Main Entrance of [facility] at 10 AM.  Pick up the phone at the desk and dial [phone].   Call this number if you have problems the morning of surgery: [phone]   Remember:   Do not eat food:After Midnight.  Do not drink clear liquids: After Midnight.  Take these medicines the morning of surgery with A SIP OF WATER: NA   Do not wear jewelry, make-up or nail polish.  Do not wear lotions, powders, or perfumes. You may wear deodorant.  Do not shave 48 hours prior to surgery.  Do not bring valuables to the hospital.  [facility] is not   responsible for any belongings or valuables brought to the hospital.  Contacts, dentures or bridgework may not be worn into surgery.  Leave suitcase in the car. After surgery it may be brought to your room.  For patients admitted to the hospital, checkout time is 11:00 AM the day of              discharge.   Patients discharged the day of surgery will not be allowed to drive             home.  Name and phone number of your driver: NA  Special Instructions:   Shower using CHG 2 nights before surgery and the night before surgery.  If you shower the day of surgery use CHG.  Use special wash - you have one bottle of CHG for all showers.  You should use approximately 1/3 of the bottle for each shower.   Please read over the following fact sheets that you were given:   Surgical Site Infection Prevention

## 2013-05-25 ENCOUNTER — Encounter (HOSPITAL_COMMUNITY): Payer: Medicaid Other | Admitting: Anesthesiology

## 2013-05-25 ENCOUNTER — Inpatient Hospital Stay (HOSPITAL_COMMUNITY)
Admission: RE | Admit: 2013-05-25 | Discharge: 2013-05-27 | DRG: 766 | Disposition: A | Discharge status: Home / Self Care | Payer: Medicaid Other | Source: Ambulatory Visit | Attending: Obstetrics and Gynecology | Admitting: Obstetrics and Gynecology

## 2013-05-25 ENCOUNTER — Encounter (HOSPITAL_COMMUNITY): Payer: Self-pay | Admitting: Anesthesiology

## 2013-05-25 ENCOUNTER — Inpatient Hospital Stay (HOSPITAL_COMMUNITY): Payer: Medicaid Other | Admitting: Anesthesiology

## 2013-05-25 ENCOUNTER — Encounter (HOSPITAL_COMMUNITY)
Admission: RE | Disposition: A | Discharge status: Home / Self Care | Payer: Self-pay | Source: Ambulatory Visit | Attending: Obstetrics and Gynecology

## 2013-05-25 DIAGNOSIS — Z90710 Acquired absence of both cervix and uterus: Secondary | ICD-10-CM | POA: Diagnosis present

## 2013-05-25 DIAGNOSIS — O34219 Maternal care for unspecified type scar from previous cesarean delivery: Secondary | ICD-10-CM

## 2013-05-25 SURGERY — Surgical Case
Anesthesia: Spinal | Site: Abdomen | Wound class: Clean Contaminated

## 2013-05-25 MED ORDER — SODIUM CHLORIDE 0.9 % IJ SOLN: 3.0000 mL | INTRAMUSCULAR | Status: DC | PRN

## 2013-05-25 MED ORDER — PHENYLEPHRINE 40 MCG/ML (10ML) SYRINGE FOR IV PUSH (FOR BLOOD PRESSURE SUPPORT)
PREFILLED_SYRINGE | INTRAVENOUS | Status: AC
  Filled 2013-05-25: qty 5

## 2013-05-25 MED ORDER — FENTANYL CITRATE 0.05 MG/ML IJ SOLN
INTRAMUSCULAR | Status: AC
  Filled 2013-05-25: qty 2

## 2013-05-25 MED ORDER — ONDANSETRON HCL 4 MG/2ML IJ SOLN: 4.0000 mg | Freq: Three times a day (TID) | INTRAMUSCULAR | Status: DC | PRN

## 2013-05-25 MED ORDER — PHENYLEPHRINE 8 MG IN D5W 100 ML (0.08MG/ML) PREMIX OPTIME
INJECTION | INTRAVENOUS | Status: DC | PRN
  Administered 2013-05-25: 50 ug/min via INTRAVENOUS

## 2013-05-25 MED ORDER — PHENYLEPHRINE 8 MG IN D5W 100 ML (0.08MG/ML) PREMIX OPTIME
INJECTION | INTRAVENOUS | Status: AC
  Filled 2013-05-25: qty 100

## 2013-05-25 MED ORDER — NALBUPHINE HCL 10 MG/ML IJ SOLN: 5.0000 mg | INTRAMUSCULAR | Status: DC | PRN

## 2013-05-25 MED ORDER — ZOLPIDEM TARTRATE 5 MG PO TABS: 5.0000 mg | ORAL_TABLET | Freq: Every evening | ORAL | Status: DC | PRN

## 2013-05-25 MED ORDER — MENTHOL 3 MG MT LOZG: 1.0000 | LOZENGE | OROMUCOSAL | Status: DC | PRN

## 2013-05-25 MED ORDER — ONDANSETRON HCL 4 MG PO TABS: 4.0000 mg | ORAL_TABLET | ORAL | Status: DC | PRN

## 2013-05-25 MED ORDER — SIMETHICONE 80 MG PO CHEW
80.0000 mg | CHEWABLE_TABLET | ORAL | Status: DC
  Administered 2013-05-27: 80 mg via ORAL
  Filled 2013-05-25 (×2): qty 1

## 2013-05-25 MED ORDER — CHLOROPROCAINE HCL 3 % IJ SOLN
INTRAMUSCULAR | Status: AC
  Filled 2013-05-25: qty 20

## 2013-05-25 MED ORDER — OXYTOCIN 10 UNIT/ML IJ SOLN
INTRAMUSCULAR | Status: AC
  Filled 2013-05-25: qty 4

## 2013-05-25 MED ORDER — FENTANYL CITRATE 0.05 MG/ML IJ SOLN
INTRAMUSCULAR | Status: DC | PRN
  Administered 2013-05-25: 85 ug via INTRAVENOUS
  Administered 2013-05-25 (×2): 50 ug via INTRAVENOUS
  Administered 2013-05-25: 15 ug via INTRATHECAL

## 2013-05-25 MED ORDER — OXYTOCIN 40 UNITS IN LACTATED RINGERS INFUSION - SIMPLE MED
INTRAVENOUS | Status: DC | PRN
  Administered 2013-05-25: 40 [IU] via INTRAVENOUS

## 2013-05-25 MED ORDER — ONDANSETRON HCL 4 MG/2ML IJ SOLN: 4.0000 mg | INTRAMUSCULAR | Status: DC | PRN

## 2013-05-25 MED ORDER — KETOROLAC TROMETHAMINE 30 MG/ML IJ SOLN: 30.0000 mg | Freq: Four times a day (QID) | INTRAMUSCULAR | Status: AC | PRN

## 2013-05-25 MED ORDER — BUPIVACAINE IN DEXTROSE 0.75-8.25 % IT SOLN
INTRATHECAL | Status: DC | PRN
  Administered 2013-05-25: 1.1 mL via INTRATHECAL

## 2013-05-25 MED ORDER — MEPERIDINE HCL 25 MG/ML IJ SOLN: 6.2500 mg | INTRAMUSCULAR | Status: DC | PRN

## 2013-05-25 MED ORDER — KETOROLAC TROMETHAMINE 30 MG/ML IJ SOLN
INTRAMUSCULAR | Status: AC
  Administered 2013-05-25: 30 mg
  Filled 2013-05-25: qty 1

## 2013-05-25 MED ORDER — NALOXONE HCL 0.4 MG/ML IJ SOLN: 0.4000 mg | INTRAMUSCULAR | Status: DC | PRN

## 2013-05-25 MED ORDER — MORPHINE SULFATE (PF) 0.5 MG/ML IJ SOLN
INTRAMUSCULAR | Status: DC | PRN
  Administered 2013-05-25: .1 mg via INTRATHECAL

## 2013-05-25 MED ORDER — LACTATED RINGERS IV SOLN
INTRAVENOUS | Status: DC
Start: 2013-05-25 — End: 2013-05-25

## 2013-05-25 MED ORDER — ONDANSETRON HCL 4 MG/2ML IJ SOLN
INTRAMUSCULAR | Status: DC | PRN
  Administered 2013-05-25: 4 mg via INTRAVENOUS

## 2013-05-25 MED ORDER — KETOROLAC TROMETHAMINE 60 MG/2ML IM SOLN
60.0000 mg | Freq: Once | INTRAMUSCULAR | Status: AC | PRN
  Filled 2013-05-25: qty 2

## 2013-05-25 MED ORDER — OXYTOCIN 40 UNITS IN LACTATED RINGERS INFUSION - SIMPLE MED: 62.5000 mL/h | INTRAVENOUS | Status: AC

## 2013-05-25 MED ORDER — WITCH HAZEL-GLYCERIN EX PADS: 1.0000 "application " | MEDICATED_PAD | CUTANEOUS | Status: DC | PRN

## 2013-05-25 MED ORDER — METOCLOPRAMIDE HCL 5 MG/ML IJ SOLN: 10.0000 mg | Freq: Three times a day (TID) | INTRAMUSCULAR | Status: DC | PRN

## 2013-05-25 MED ORDER — SCOPOLAMINE 1 MG/3DAYS TD PT72: 1.0000 | MEDICATED_PATCH | Freq: Once | TRANSDERMAL | Status: DC

## 2013-05-25 MED ORDER — LANOLIN HYDROUS EX OINT: 1.0000 "application " | TOPICAL_OINTMENT | CUTANEOUS | Status: DC | PRN

## 2013-05-25 MED ORDER — SCOPOLAMINE 1 MG/3DAYS TD PT72
MEDICATED_PATCH | TRANSDERMAL | Status: AC
  Administered 2013-05-25: 1.5 mg via TRANSDERMAL
  Filled 2013-05-25: qty 1

## 2013-05-25 MED ORDER — FENTANYL CITRATE 0.05 MG/ML IJ SOLN
INTRAMUSCULAR | Status: AC
  Administered 2013-05-25: 50 ug via INTRAVENOUS
  Filled 2013-05-25: qty 2

## 2013-05-25 MED ORDER — DEXTROSE 5 % IV SOLN: 1.0000 ug/kg/h | INTRAVENOUS | Status: DC | PRN

## 2013-05-25 MED ORDER — LACTATED RINGERS IV SOLN
INTRAVENOUS | Status: DC | PRN
  Administered 2013-05-25: 13:00:00 via INTRAVENOUS

## 2013-05-25 MED ORDER — LACTATED RINGERS IV SOLN
INTRAVENOUS | Status: DC
  Administered 2013-05-25: 23:00:00 via INTRAVENOUS

## 2013-05-25 MED ORDER — CEFAZOLIN SODIUM-DEXTROSE 2-3 GM-% IV SOLR
INTRAVENOUS | Status: AC
  Administered 2013-05-25: 2 g via INTRAVENOUS
  Filled 2013-05-25: qty 50

## 2013-05-25 MED ORDER — DIBUCAINE 1 % RE OINT: 1.0000 "application " | TOPICAL_OINTMENT | RECTAL | Status: DC | PRN

## 2013-05-25 MED ORDER — IBUPROFEN 600 MG PO TABS
600.0000 mg | ORAL_TABLET | Freq: Four times a day (QID) | ORAL | Status: DC
  Administered 2013-05-26 – 2013-05-27 (×5): 600 mg via ORAL
  Filled 2013-05-25 (×6): qty 1

## 2013-05-25 MED ORDER — SCOPOLAMINE 1 MG/3DAYS TD PT72
1.0000 | MEDICATED_PATCH | Freq: Once | TRANSDERMAL | Status: DC
  Administered 2013-05-25: 1.5 mg via TRANSDERMAL

## 2013-05-25 MED ORDER — DIPHENHYDRAMINE HCL 50 MG/ML IJ SOLN: 25.0000 mg | INTRAMUSCULAR | Status: DC | PRN

## 2013-05-25 MED ORDER — DIPHENHYDRAMINE HCL 50 MG/ML IJ SOLN: 12.5000 mg | INTRAMUSCULAR | Status: DC | PRN

## 2013-05-25 MED ORDER — OXYCODONE-ACETAMINOPHEN 5-325 MG PO TABS
1.0000 | ORAL_TABLET | ORAL | Status: DC | PRN
  Administered 2013-05-26 – 2013-05-27 (×7): 1 via ORAL
  Filled 2013-05-25 (×8): qty 1

## 2013-05-25 MED ORDER — SENNOSIDES-DOCUSATE SODIUM 8.6-50 MG PO TABS
2.0000 | ORAL_TABLET | ORAL | Status: DC
  Administered 2013-05-26 – 2013-05-27 (×2): 2 via ORAL
  Filled 2013-05-25 (×2): qty 2

## 2013-05-25 MED ORDER — CHLOROPROCAINE HCL 3 % IJ SOLN
INTRAMUSCULAR | Status: DC | PRN
  Administered 2013-05-25: 20 mL

## 2013-05-25 MED ORDER — LACTATED RINGERS IV SOLN
INTRAVENOUS | Status: DC
  Administered 2013-05-25 (×3): via INTRAVENOUS

## 2013-05-25 MED ORDER — DIPHENHYDRAMINE HCL 25 MG PO CAPS: 25.0000 mg | ORAL_CAPSULE | ORAL | Status: DC | PRN

## 2013-05-25 MED ORDER — FENTANYL CITRATE 0.05 MG/ML IJ SOLN
25.0000 ug | INTRAMUSCULAR | Status: DC | PRN
  Administered 2013-05-25 (×2): 50 ug via INTRAVENOUS
  Administered 2013-05-25: 25 ug via INTRAVENOUS

## 2013-05-25 MED ORDER — SIMETHICONE 80 MG PO CHEW: 80.0000 mg | CHEWABLE_TABLET | ORAL | Status: DC | PRN

## 2013-05-25 MED ORDER — ONDANSETRON HCL 4 MG/2ML IJ SOLN
INTRAMUSCULAR | Status: AC
  Filled 2013-05-25: qty 2

## 2013-05-25 MED ORDER — MORPHINE SULFATE 0.5 MG/ML IJ SOLN
INTRAMUSCULAR | Status: AC
  Filled 2013-05-25: qty 10

## 2013-05-25 MED ORDER — SIMETHICONE 80 MG PO CHEW
80.0000 mg | CHEWABLE_TABLET | Freq: Three times a day (TID) | ORAL | Status: DC
  Administered 2013-05-26 – 2013-05-27 (×4): 80 mg via ORAL
  Filled 2013-05-25 (×4): qty 1

## 2013-05-25 MED ORDER — PRENATAL MULTIVITAMIN CH
1.0000 | ORAL_TABLET | Freq: Every day | ORAL | Status: DC
  Administered 2013-05-26 – 2013-05-27 (×2): 1 via ORAL
  Filled 2013-05-25 (×2): qty 1

## 2013-05-25 MED ORDER — DIPHENHYDRAMINE HCL 25 MG PO CAPS: 25.0000 mg | ORAL_CAPSULE | Freq: Four times a day (QID) | ORAL | Status: DC | PRN

## 2013-05-25 MED ORDER — KETOROLAC TROMETHAMINE 30 MG/ML IJ SOLN
30.0000 mg | Freq: Four times a day (QID) | INTRAMUSCULAR | Status: AC | PRN
  Administered 2013-05-25: 30 mg via INTRAVENOUS
  Filled 2013-05-25: qty 1

## 2013-05-25 MED ORDER — TETANUS-DIPHTH-ACELL PERTUSSIS 5-2.5-18.5 LF-MCG/0.5 IM SUSP
0.5000 mL | Freq: Once | INTRAMUSCULAR | Status: AC
  Administered 2013-05-26: 0.5 mL via INTRAMUSCULAR
  Filled 2013-05-25: qty 0.5

## 2013-05-25 SURGICAL SUPPLY — 38 items
BENZOIN TINCTURE PRP APPL 2/3 (GAUZE/BANDAGES/DRESSINGS) IMPLANT
CLAMP CORD UMBIL (MISCELLANEOUS) IMPLANT
CLOTH BEACON ORANGE TIMEOUT ST (SAFETY) ×2 IMPLANT
DRAPE LG THREE QUARTER DISP (DRAPES) IMPLANT
DRSG OPSITE POSTOP 4X10 (GAUZE/BANDAGES/DRESSINGS) ×2 IMPLANT
DURAPREP 26ML APPLICATOR (WOUND CARE) ×2 IMPLANT
ELECT REM PT RETURN 9FT ADLT (ELECTROSURGICAL) ×2
ELECTRODE REM PT RTRN 9FT ADLT (ELECTROSURGICAL) ×1 IMPLANT
EXTRACTOR VACUUM KIWI (MISCELLANEOUS) IMPLANT
EXTRACTOR VACUUM M CUP 4 TUBE (SUCTIONS) IMPLANT
GLOVE BIO SURGEON ST LM GN SZ9 (GLOVE) ×2 IMPLANT
GLOVE BIOGEL PI IND STRL 9 (GLOVE) ×1 IMPLANT
GLOVE BIOGEL PI INDICATOR 9 (GLOVE) ×1
GOWN PREVENTION PLUS XLARGE (GOWN DISPOSABLE) ×2 IMPLANT
GOWN STRL REIN 3XL LVL4 (GOWN DISPOSABLE) ×2 IMPLANT
GOWN STRL REIN XL XLG (GOWN DISPOSABLE) IMPLANT
NEEDLE HYPO 25X5/8 SAFETYGLIDE (NEEDLE) IMPLANT
NS IRRIG 1000ML POUR BTL (IV SOLUTION) ×2 IMPLANT
PACK C SECTION WH (CUSTOM PROCEDURE TRAY) ×2 IMPLANT
PAD OB MATERNITY 4.3X12.25 (PERSONAL CARE ITEMS) ×2 IMPLANT
RETRACTOR WND ALEXIS 25 LRG (MISCELLANEOUS) IMPLANT
RTRCTR C-SECT PINK 25CM LRG (MISCELLANEOUS) IMPLANT
RTRCTR WOUND ALEXIS 25CM LRG (MISCELLANEOUS)
STRIP CLOSURE SKIN 1/2X4 (GAUZE/BANDAGES/DRESSINGS) ×2 IMPLANT
SUT CHROMIC 0 CTX 36 (SUTURE) ×4 IMPLANT
SUT MNCRL 0 VIOLET CTX 36 (SUTURE) ×1 IMPLANT
SUT MONOCRYL 0 CTX 36 (SUTURE) ×1
SUT PDS AB 0 CT1 27 (SUTURE) ×2 IMPLANT
SUT VIC AB 0 CT1 27 (SUTURE) ×1
SUT VIC AB 0 CT1 27XBRD ANBCTR (SUTURE) ×1 IMPLANT
SUT VIC AB 2-0 CT1 (SUTURE) ×2 IMPLANT
SUT VIC AB 2-0 CT1 27 (SUTURE) ×2
SUT VIC AB 2-0 CT1 TAPERPNT 27 (SUTURE) ×2 IMPLANT
SUT VIC AB 4-0 KS 27 (SUTURE) ×2 IMPLANT
SYR BULB IRRIGATION 50ML (SYRINGE) IMPLANT
TOWEL OR 17X24 6PK STRL BLUE (TOWEL DISPOSABLE) ×2 IMPLANT
TRAY FOLEY CATH 14FR (SET/KITS/TRAYS/PACK) ×2 IMPLANT
WATER STERILE IRR 1000ML POUR (IV SOLUTION) ×2 IMPLANT

## 2013-05-25 NOTE — Consult Note (Signed)
Neonatology Note:  Attendance at C-section:  I was asked by Dr. Ferguson to attend this repeat C/S at term. The mother is a G3P2 O pos, GBS neg with an uncomplicated pregnancy. ROM at delivery, fluid clear. Infant vigorous with good spontaneous cry and tone. Needed only minimal bulb suctioning. Ap 9/9. Lungs clear to ausc in DR. To CN to care of Pediatrician.  Etan Vasudevan C. Shanyce Daris, MD  

## 2013-05-25 NOTE — Brief Op Note (Signed)
05/25/2013  2:41 PM  PATIENT:  Ana Goodwin  32 y.o. female  PRE-OPERATIVE DIAGNOSIS:   REPEAT c/section, pregnancy 39 wks  POST-OPERATIVE DIAGNOSIS:   REPEAT c/section pregnancy 39 weeks delivered   PROCEDURE:  Procedure(s): REPEAT CESAREAN SECTION LOW TRANSVERSE WITH VERTICAL ABDOMINAL INCISION (N/A)  SURGEON:  Surgeon(s) and Role:    * Tilda Burrow, MD - Primary  PHYSICIAN ASSISTANT: Odom, DO  ASSISTANTS: none   ANESTHESIA:   local and spinal  EBL:  Total I/O In: 1800 [I.V.:1800] Out: 1000 [Urine:250; Blood:750]  BLOOD ADMINISTERED:none  DRAINS: Urinary Catheter (Foley)   LOCAL MEDICATIONS USED:  Amount: 30  ml  nesacaine intraabdominal SPECIMEN:  Source of Specimen:  placenta to L&D  DISPOSITION OF SPECIMEN:  PATHOLOGY  COUNTS:  YES  TOURNIQUET:  * No tourniquets in log *  DICTATION: .Dragon Dictation  PLAN OF CARE: Admit to inpatient   PATIENT DISPOSITION:  PACU - hemodynamically stable.   Delay start of Pharmacological VTE agent (>24hrs) due to surgical blood loss or risk of bleeding: yes

## 2013-05-25 NOTE — Anesthesia Procedure Notes (Signed)
Spinal  Patient location during procedure: OR Start time: 05/25/2013 12:34 PM Staffing Performed by: anesthesiologist  Preanesthetic Checklist Completed: patient identified, site marked, surgical consent, pre-op evaluation, timeout performed, IV checked, risks and benefits discussed and monitors and equipment checked Spinal Block Patient position: sitting Prep: site prepped and draped and DuraPrep Patient monitoring: continuous pulse ox, blood pressure and heart rate Approach: midline Location: L3-4 Injection technique: single-shot Needle Needle type: Pencan  Needle gauge: 24 G Needle length: 10 cm Assessment Sensory level: T4 Additional Notes Clear free flow CSF on first attempt.  No paresthesia.  Patient tolerated procedure well with no apparent complications.  Jasmine December, MD

## 2013-05-25 NOTE — Op Note (Signed)
Procedure: Repeat low transverse cervical cesarean section, midline vertical lower abdominal incision to access abdominal cavity   surgeon Emelda Fear, M.D., [place]  DO  Details of procedure: Patient was taken operating room spinal anesthesia introduced Foley catheter inserted and abdomen prepped. The prepping was then extended a second time in order to address the need for a vertical incision. 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 A 14 cm long by 6 centimeter wide ellipse of skin and underlying fatty tissue was removed., And then the abdominal cavity entered through the midline and sharp dissection through the fascia and blunt dissection into the peritoneum was was performed Omental adhesions to the anterior abdominal wall and as well as the fundus of the uterus were encountered. Portion of these adhesions was removed in order to allow access to the lower uterine segment and bladder flap developed. Blade was in place. Transverse uterine incision was made using sharp dissection, and the incision was extended laterally using index finger traction. The vertex was rotated into the incision  with some difficulty, and the body being on the left side, and once the vertex could be deflexed into the incision from approximately applied and the baby delivered. The baby passed to waiting pediatrician see their notes for details. Placenta delivered easily using uterine crit a massage, then uterus irrigated and closed in a running locking first layer was 0 Monocryl, and then a second layer of running 0 Monocryl applied. Pelvis was irrigated. There was a tendency to ooze. None was inspected and a couple spots on the omentum cauterized as necessary. Additionally there were some bleeding from the subcutaneous tissues that required point cautery. Hemostasis was considered satisfactory before perineal closure considered. First we freed up the  omental adhesions to the uterine fundus and all the adhesions to the right side of the midline. Some of the adhesion the left side were left in situ. adhesions to the uterus were completely freed. Anterior abdominal wall was then closed using running 0 PDS with the fascia and peritoneum were included in the same closure. Attention was then directed to the subcutaneous fatty tissues. The cutaneous tissues could be pulled into the midline with a series of interrupted 20 plain and 2-0 Vicryl sutures, resulting in good midline approximation. A second layer of mattress sutures of 2-0 Vicryl were used in the superficial subcutaneous fatty tissues to take the tension off of the skin incision which could then be closed with a midline subcuticular 4-0 Vicryl closure using a Keith needle the old scar was somewhat asymmetric and and deviated to the patient's right just above the opening to this crease that was considered acceptable for reapproximation with strips. Patient tolerated procedure well went to recovery room in good condition sponge and needle counts correct EBL 600 cc

## 2013-05-25 NOTE — Transfer of Care (Signed)
Immediate Anesthesia Transfer of Care Note  Patient: Ana Goodwin  Procedure(s) Performed: Procedure(s): REPEAT CESAREAN SECTION LOW TRANSVERSE WITH VERTICAL ABDOMINAL INCISION (N/A)  Patient Location: PACU  Anesthesia Type:Spinal  Level of Consciousness: awake, alert  and oriented  Airway & Oxygen Therapy: Patient Spontanous Breathing  Post-op Assessment: Report given to PACU RN and Post -op Vital signs reviewed and stable  Post vital signs: Reviewed, stable and unstable  Complications: No apparent anesthesia complications

## 2013-05-25 NOTE — Preoperative (Signed)
Beta Blockers   Reason not to administer Beta Blockers:Not Applicable 

## 2013-05-25 NOTE — Anesthesia Preprocedure Evaluation (Addendum)
Anesthesia Evaluation  Patient identified by MRN, date of birth, ID band Patient awake    Reviewed: Allergy & Precautions, H&P , NPO status , Patient's Chart, lab work & pertinent test results, reviewed documented beta blocker date and time   History of Anesthesia Complications Negative for: history of anesthetic complications  Airway Mallampati: III TM Distance: >3 FB Neck ROM: full    Dental no notable dental hx. (+) Teeth Intact   Pulmonary neg pulmonary ROS,  breath sounds clear to auscultation  Pulmonary exam normal       Cardiovascular negative cardio ROS  Rhythm:regular Rate:Normal     Neuro/Psych  Headaches,  Neuromuscular disease (h/o Bell's palsy x2 (last episode 5 years ago), h/o low back pain and difficulty walking (2  years ago)) negative psych ROS   GI/Hepatic negative GI ROS, Neg liver ROS,   Endo/Other  Morbid obesity  Renal/GU negative Renal ROS  negative genitourinary   Musculoskeletal   Abdominal Normal abdominal exam  (+)   Peds  Hematology negative hematology ROS (+)   Anesthesia Other Findings Pt is 4'9" tall  Reproductive/Obstetrics (+) Pregnancy (h/o c/s x2, for repeat)                         Anesthesia Physical Anesthesia Plan  ASA: III  Anesthesia Plan: Spinal   Post-op Pain Management:    Induction:   Airway Management Planned:   Additional Equipment:   Intra-op Plan:   Post-operative Plan:   Informed Consent: I have reviewed the patients History and Physical, chart, labs and discussed the procedure including the risks, benefits and alternatives for the proposed anesthesia with the patient or authorized representative who has indicated his/her understanding and acceptance.     Plan Discussed with: Surgeon and CRNA  Anesthesia Plan Comments:        Anesthesia Quick Evaluation

## 2013-05-25 NOTE — H&P (Signed)
Ana Goodwin is a 32 y.o. female presenting for repeat cesarean section.  She does not desire BTS at time of cesarean. She has 2 incisions, vertical abdominal and transverse (second C/S). She desires that we use the FIRST vertical incision, which healed more poorly. 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 She does NOT recall the name of the medicine. History OB History   Grav Para Term Preterm Abortions TAB SAB Ect Mult Living   3 2 2  0 0 0 0 0 0 2     Past Medical History  Diagnosis Date  . 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 Pain in lower back     2 yrs ago/seen at hospital/lasted one months to be able to stand alone and walk  . Ulcer of abdomen wall   . Headache(784.0)   . Neuromuscular disorder    Past Surgical History  Procedure Laterality Date  . Cesarean section      X 2   Family History: family history includes Cancer in her father; Diabetes in her mother. Social History:  reports that she has never smoked. She has never used smokeless tobacco. She reports that she does not drink alcohol or use illicit drugs.   Prenatal Transfer Tool  Maternal Diabetes: No Genetic Screening: Normal Maternal Ultrasounds/Referrals: Normal at the office of Dr Gaynell Face, reported as normal. Fetal Ultrasounds or other Referrals:  None Maternal Substance Abuse:  No Significant Maternal Medications:  None Significant Maternal Lab Results:  Lab values include: Group B Strep negative Other Comments:  None  Review of Systems  Constitutional: Negative.  Negative for fever.  Gastrointestinal: Negative.   Skin: Negative.       Blood pressure 119/80, pulse 77, temperature 97.7 F (36.5 C), temperature source Oral, resp. rate 18, last menstrual period 08/25/2012, SpO2  100.00%. Exam Physical Exam  Constitutional: She appears well-developed and well-nourished.  HENT:  Head: Normocephalic.  Eyes: Pupils are equal, round, and reactive to light.  Cardiovascular: Normal rate and regular rhythm.   Respiratory: Effort normal.  GI: Soft.  geravid uterus c/w dates prior incision vertical and transverse. The Vertical is less well healed    Prenatal labs: ABO, Rh: --/--/O POS, O POS (11/26 1005) Antibody: NEG (11/26 1005) Rubella: 11.80 (04/30 1542) RPR: NON REACTIVE (11/26 1005)  HBsAg: NEGATIVE (04/30 1542)  HIV: NON REACTIVE (09/29 1550)  GBS:     Assessment/Plan: Pregnancy 39 wk  Prior cesarean x 2, hx prior midline vertical skin incision at first cesarean, desiring that we use the vertical incision Plan  Repeat cesarean thru vertical incision access.   Cashus Halterman V 05/25/2013, 11:59 AM

## 2013-05-25 NOTE — Anesthesia Postprocedure Evaluation (Signed)
  Anesthesia Post-op Note  Anesthesia Post Note  Patient: Ana Goodwin  Procedure(s) Performed: Procedure(s) (LRB): REPEAT CESAREAN SECTION LOW TRANSVERSE WITH VERTICAL ABDOMINAL INCISION (N/A)  Anesthesia type: Spinal  Patient location: PACU  Post pain: Pain level controlled  Post assessment: Post-op Vital signs reviewed  Last Vitals:  Filed Vitals:   05/25/13 1530  BP: 98/52  Pulse: 73  Temp: 37 C  Resp: 21    Post vital signs: Reviewed  Level of consciousness: awake  Complications: No apparent anesthesia complications

## 2013-05-26 LAB — CBC
HCT: 30.1 % — ABNORMAL LOW (ref 36.0–46.0)
Hemoglobin: 10.2 g/dL — ABNORMAL LOW (ref 12.0–15.0)
MCHC: 33.9 g/dL (ref 30.0–36.0)
MCV: 82 fL (ref 78.0–100.0)
RBC: 3.67 MIL/uL — ABNORMAL LOW (ref 3.87–5.11)
RDW: 14 % (ref 11.5–15.5)
WBC: 9.7 10*3/uL (ref 4.0–10.5)

## 2013-05-26 MED ORDER — INFLUENZA VAC SPLIT QUAD 0.5 ML IM SUSP
0.5000 mL | INTRAMUSCULAR | Status: AC
  Administered 2013-05-27: 0.5 mL via INTRAMUSCULAR
  Filled 2013-05-26: qty 0.5

## 2013-05-26 NOTE — Lactation Note (Signed)
This note was copied from the chart of Ana [place]. Lactation Consultation Note Follow up visit at 32 hours.  Mom reports breastfeeding is going well.  She can hand express colostrum, but does not hear swallows.  Baby cueing with diaper check.  Attempted in cross cradle position on left breast, baby unable to latch due to position.  Assisted with football hold skin to skin and good pillow support.  Baby latches well with wide flanged lips, rhythmic suckling and swallows heard.  Encouraged mom to check for blue/green line in diaper, no documented void in almost 24 hours.  Mom to call for assist as needed.   Patient Name: Ana Goodwin Date: 05/26/2013 Reason for consult: Follow-up assessment   Maternal Data    Feeding Feeding Type: Breast Fed Length of feed:  (observed 5 minutes with swallows)  LATCH Score/Interventions Latch: Grasps breast easily, tongue down, lips flanged, rhythmical sucking.  Audible Swallowing: Spontaneous and intermittent Intervention(s): Skin to skin;Hand expression  Type of Nipple: Everted at rest and after stimulation  Comfort (Breast/Nipple): Soft / non-tender     Hold (Positioning): Assistance needed to correctly position infant at breast and maintain latch.  LATCH Score: 9  Lactation Tools Discussed/Used     Consult Status Consult Status: Follow-up Date: 05/27/13 Follow-up type: In-patient    Jannifer Rodney 05/26/2013, 9:30 PM

## 2013-05-26 NOTE — Progress Notes (Signed)
Subjective: Postpartum Day 1: Cesarean Delivery Patient reports incisional pain, tolerating PO, + flatus and no problems voiding.  Feels some bloating but no BM, yet.  Objective: Vital signs in last 24 hours: Temp:  [97.7 F (36.5 C)-99.9 F (37.7 C)] 98.4 F (36.9 C) (11/29 0354) Pulse Rate:  [73-96] 79 (11/29 0354) Resp:  [13-21] 20 (11/29 0354) BP: (89-119)/(52-80) 103/68 mmHg (11/29 0354) SpO2:  [96 %-100 %] 97 % (11/29 0354) Weight:  [83.915 kg (185 lb)] 83.915 kg (185 lb) (11/28 1735)  Physical Exam:  General: alert, cooperative and no distress Lochia: appropriate Uterine Fundus: firm Incision: vertical skin incision healing well, no significant drainage, dressing clean/dry/intact DVT Evaluation: No evidence of DVT seen on physical exam. Negative Homan's sign. No cords or calf tenderness. No significant calf/ankle edema.   Recent Labs  05/23/13 1005 05/26/13 0555  HGB 11.7* 10.2*  HCT 33.9* 30.1*    Assessment/Plan: Status post Cesarean section. Doing well postoperatively.  Continue current care.  Bobbye Morton, MD PGY-2, [facility] 05/26/2013, 8:00 AM

## 2013-05-26 NOTE — Anesthesia Postprocedure Evaluation (Signed)
Anesthesia Post Note  Patient: Ana Goodwin  Procedure(s) Performed: Procedure(s) (LRB): REPEAT CESAREAN SECTION LOW TRANSVERSE WITH VERTICAL ABDOMINAL INCISION (N/A)  Anesthesia type: Spinal  Patient location: Mother/Baby  Post pain: Pain level controlled  Post assessment: Post-op Vital signs reviewed  Last Vitals:  Filed Vitals:   05/26/13 0354  BP: 103/68  Pulse: 79  Temp: 36.9 C  Resp: 20    Post vital signs: Reviewed  Level of consciousness: awake  Complications: No apparent anesthesia complications

## 2013-05-27 MED ORDER — OXYCODONE-ACETAMINOPHEN 5-325 MG PO TABS: 1.0000 | ORAL_TABLET | ORAL | Status: DC | PRN

## 2013-05-27 MED ORDER — IBUPROFEN 600 MG PO TABS: 600.0000 mg | ORAL_TABLET | Freq: Four times a day (QID) | ORAL | Status: DC

## 2013-05-27 NOTE — Discharge Summary (Signed)
Obstetric Discharge Summary Reason for Admission: cesarean section Prenatal Procedures: ultrasound Intrapartum Procedures: cesarean: low cervical, vertical Postpartum Procedures: none Complications-Operative and Postpartum: none Hemoglobin  Date Value Range Status  05/26/2013 10.2* 12.0 - 15.0 g/dL Final     HCT  Date Value Range Status  05/26/2013 30.1* 36.0 - 46.0 % Final    Physical Exam:  General: alert, cooperative, appears stated age and no distress Lochia: appropriate Uterine Fundus: firm Incision: healing well DVT Evaluation: No evidence of DVT seen on physical exam. Negative Homan's sign. No significant calf/ankle edema.  Discharge Diagnoses: Term Pregnancy-delivered  Discharge Information: Date: 05/27/2013 Activity: pelvic rest Diet: routine Medications: PNV, Ibuprofen and Percocet Condition: stable and improved Instructions: refer to practice specific booklet Discharge to: home   Newborn Data: Live born female  Birth Weight: 7 lb 12 oz (3515 g) APGAR: 9, 9  Home with mother.  Ana Goodwin 05/27/2013, 6:33 AM

## 2013-05-27 NOTE — Progress Notes (Signed)
I have seen and examined this patient and I agree with the above. SHAW, KIMBERLY 10:01 PM 05/27/2013

## 2013-05-28 ENCOUNTER — Encounter (HOSPITAL_COMMUNITY): Payer: Self-pay | Admitting: Obstetrics and Gynecology

## 2013-06-05 ENCOUNTER — Ambulatory Visit (INDEPENDENT_AMBULATORY_CARE_PROVIDER_SITE_OTHER): Payer: Medicaid Other | Admitting: Obstetrics & Gynecology

## 2013-06-05 ENCOUNTER — Encounter: Payer: Self-pay | Admitting: Obstetrics & Gynecology

## 2013-06-05 VITALS — BP 91/69 | HR 72 | Ht <= 58 in | Wt 172.0 lb

## 2013-06-05 DIAGNOSIS — Z09 Encounter for follow-up examination after completed treatment for conditions other than malignant neoplasm: Secondary | ICD-10-CM

## 2013-06-05 DIAGNOSIS — E669 Obesity, unspecified: Secondary | ICD-10-CM

## 2013-06-05 NOTE — Progress Notes (Signed)
   Subjective:    Patient ID: Ana Goodwin, female    DOB: 1981/03/17, 32 y.o.   MRN: [id number]  HPI  This 32 yo H lady is here because she was worried about her vertical incision done 2 weeks ago for a repeat c/s. She says that it itches and has some drainage. She denies pain or fever.  Review of Systems     Objective:   Physical Exam  Her vertical incision has some redness at the edges that appears to be a reaction to the tape that was holding a bandage in place. I removed the tape and bandage. I see no evidence of infection. There is only a tiny drop of serous fluid with  aggressive squeezing of the fat surrounding her incision.      Assessment & Plan:  Post op- healing fine. I have given her reassurance. I have suggested that she not use any more tape on her incision. RTC 1 week for re check of incision.

## 2013-06-11 ENCOUNTER — Encounter: Payer: Self-pay | Admitting: Family Medicine

## 2013-06-11 ENCOUNTER — Ambulatory Visit (INDEPENDENT_AMBULATORY_CARE_PROVIDER_SITE_OTHER): Payer: Medicaid Other | Admitting: Family Medicine

## 2013-06-11 VITALS — BP 122/83 | HR 82 | Ht <= 58 in | Wt 170.0 lb

## 2013-06-11 DIAGNOSIS — Z09 Encounter for follow-up examination after completed treatment for conditions other than malignant neoplasm: Secondary | ICD-10-CM

## 2013-06-11 DIAGNOSIS — O34219 Maternal care for unspecified type scar from previous cesarean delivery: Secondary | ICD-10-CM

## 2013-06-11 NOTE — Patient Instructions (Signed)
Postpartum Care After Cesarean Delivery °After you deliver your newborn (postpartum period), the usual stay in the hospital is 24 72 hours. If there were problems with your labor or delivery, or if you have other medical problems, you might be in the hospital longer.  °While you are in the hospital, you will receive help and instructions on how to care for yourself and your newborn during the postpartum period.  °While you are in the hospital: °· It is normal for you to have pain or discomfort from the incision in your abdomen. Be sure to tell your nurses when you are having pain, where the pain is located, and what makes the pain worse. °· If you are breastfeeding, you may feel uncomfortable contractions of your uterus for a couple of weeks. This is normal. The contractions help your uterus get back to normal size. °· It is normal to have some bleeding after delivery. °· For the first 1 3 days after delivery, the flow is red and the amount may be similar to a period. °· It is common for the flow to start and stop. °· In the first few days, you may pass some small clots. Let your nurses know if you begin to pass large clots or your flow increases. °· Do not  flush blood clots down the toilet before having the nurse look at them. °· During the next 3 10 days after delivery, your flow should become more watery and pink or brown-tinged in color. °· Ten to fourteen days after delivery, your flow should be a small amount of yellowish-white discharge. °· The amount of your flow will decrease over the first few weeks after delivery. Your flow may stop in 6 8 weeks. Most women have had their flow stop by 12 weeks after delivery. °· You should change your sanitary pads frequently. °· Wash your hands thoroughly with soap and water for at least 20 seconds after changing pads, using the toilet, or before holding or feeding your newborn. °· Your intravenous (IV) tubing will be removed when you are drinking enough fluids. °· The  urine drainage tube (urinary catheter) that was inserted before delivery may be removed within 6 8 hours after delivery or when feeling returns to your legs. You should feel like you need to empty your bladder within the first 6 8 hours after the catheter has been removed. °· In case you become weak, lightheaded, or faint, call your nurse before you get out of bed for the first time and before you take a shower for the first time. °· Within the first few days after delivery, your breasts may begin to feel tender and full. This is called engorgement. Breast tenderness usually goes away within 48 72 hours after engorgement occurs. You may also notice milk leaking from your breasts. If you are not breastfeeding, do not stimulate your breasts. Breast stimulation can make your breasts produce more milk. °· Spending as much time as possible with your newborn is very important. During this time, you and your newborn can feel close and get to know each other. Having your newborn stay in your room (rooming in) will help to strengthen the bond with your newborn. It will give you time to get to know your newborn and become comfortable caring for your newborn. °· Your hormones change after delivery. Sometimes the hormone changes can temporarily cause you to feel sad or tearful. These feelings should not last more than a few days. If these feelings last longer   than that, you should talk to your caregiver. °· If desired, talk to your caregiver about methods of family planning or contraception. °· Talk to your caregiver about immunizations. Your caregiver may want you to have the following immunizations before leaving the hospital: °· Tetanus, diphtheria, and pertussis (Tdap) or tetanus and diphtheria (Td) immunization. It is very important that you and your family (including grandparents) or others caring for your newborn are up-to-date with the Tdap or Td immunizations. The Tdap or Td immunization can help protect your newborn  from getting ill. °· Rubella immunization. °· Varicella (chickenpox) immunization. °· Influenza immunization. You should receive this annual immunization if you did not receive the immunization during your pregnancy. °Document Released: 03/08/2012 Document Reviewed: 03/08/2012 °[employer]® Patient Information ©2014 [employer]. ° °

## 2013-06-11 NOTE — Progress Notes (Signed)
   Subjective:    Patient ID: Ana Goodwin, female    DOB: [birth year], 32 y.o.   MRN: [id number]  Wound Check    Here for re-check of incision.  Here last week for erythema and drainage.  That has essentially resolved.  Review of Systems  Constitutional: Negative for fever and chills.  Skin: Negative for pallor and rash.       Objective:   Physical Exam  Vitals reviewed. Cardiovascular: Normal rate.   Pulmonary/Chest: Effort normal.  Abdominal: Soft. There is no tenderness.  Skin:  Incision is clean and dry.  There are 2 slightly open areas which are treated with AgNO3.          Assessment & Plan:

## 2013-06-11 NOTE — Assessment & Plan Note (Signed)
Incision is healing well

## 2013-07-02 ENCOUNTER — Encounter: Payer: Self-pay | Admitting: Obstetrics & Gynecology

## 2013-07-02 ENCOUNTER — Ambulatory Visit (INDEPENDENT_AMBULATORY_CARE_PROVIDER_SITE_OTHER): Payer: Medicaid Other | Admitting: Obstetrics & Gynecology

## 2013-07-02 VITALS — BP 111/72 | HR 90 | Ht <= 58 in | Wt 176.8 lb

## 2013-07-02 DIAGNOSIS — Z9889 Other specified postprocedural states: Secondary | ICD-10-CM

## 2013-07-02 DIAGNOSIS — Z98891 History of uterine scar from previous surgery: Principal | ICD-10-CM

## 2013-07-02 NOTE — Progress Notes (Signed)
   Subjective:     Ana Goodwin is a 33 y.o. [id number] female who presents for a postpartum visit. Patient is Spanish-speaking only, Spanish interpreter present for this encounter. She is 6 weeks postpartum following a RLTCS via vertical incision. I have fully reviewed the prenatal and intrapartum course. The delivery was at 39 gestational weeks.  Postpartum course has been uncomplicated. Baby's course has been uncomplicated. Baby is feeding by breast. Bleeding no bleeding. Bowel function is normal. Bladder function is normal. Patient is not sexually active. Contraception method is Mirena IUD. Postpartum depression screening: negative.    The following portions of the patient's history were reviewed and updated as appropriate: allergies, current medications, past family history, past medical history, past social history, past surgical history and problem list.  Review of Systems Pertinent items are noted in HPI.  Normal pap, negative HRHPV in 10/30/12.  Objective:    BP 111/72  Pulse 90  Ht 4\' 8"  (1.422 m)  Wt 176 lb 12.8 oz (80.196 kg)  BMI 39.66 kg/m2  LMP 06/10/2013  Breastfeeding? No  General:  alert, cooperative and no distress   Breasts:  inspection negative, no nipple discharge or bleeding, no masses or nodularity palpable  Lungs: clear to auscultation bilaterally  Heart:  regular rate and rhythm  Abdomen: soft, non-tender; bowel sounds normal; no masses,  no organomegaly.  Incision is well-healed, no drainage, no erythema, no induration.   Pelvic:  Deferred        Assessment:   Normal postpartum exam. Pap smear not done at today's visit.   Plan:   1. Contraception: Mirena IUD, will call [facility] to set appointment 2. Follow up as needed.   Ana CollinsUGONNA  Alveta Quintela, MD, FACOG Attending Obstetrician & Gynecologist Faculty Practice, [facility]

## 2013-07-02 NOTE — Patient Instructions (Signed)
Regrese a la clinica cuando tenga su cita. Si tiene problemas o preguntas, llama a la clinica o vaya a la sala [facility] emerg[facility] al [facility].    

## 2013-07-17 ENCOUNTER — Encounter: Payer: Self-pay | Admitting: Obstetrics & Gynecology

## 2013-07-17 ENCOUNTER — Ambulatory Visit (INDEPENDENT_AMBULATORY_CARE_PROVIDER_SITE_OTHER): Payer: Medicaid Other | Admitting: Obstetrics & Gynecology

## 2013-07-17 VITALS — BP 111/75 | HR 78 | Ht <= 58 in | Wt 178.0 lb

## 2013-07-17 DIAGNOSIS — Z30430 Encounter for insertion of intrauterine contraceptive device: Secondary | ICD-10-CM

## 2013-07-17 DIAGNOSIS — Z975 Presence of (intrauterine) contraceptive device: Secondary | ICD-10-CM

## 2013-07-17 DIAGNOSIS — Z01812 Encounter for preprocedural laboratory examination: Secondary | ICD-10-CM

## 2013-07-17 NOTE — Progress Notes (Signed)
   Subjective:    Patient ID: Ana Goodwin, female    DOB: 07/01/1980, 33 y.o.   MRN: [id number]  HPI  33 yo P3 is here today for a Mirena. She doesn't want more children. She had an IUD about 10 years ago.  Review of Systems     Objective:   Physical Exam  UPT negative, consent signed, Time out procedure done. Bimanual exam revealed a NSSA uterus and normal adnexa. Nulliparous cervix prepped with betadine and grasped with a single tooth tenaculum. Mirena was easily placed and the strings were cut to 3-4 cm. Uterus sounded to 9 cm. She tolerated the procedure well.      Assessment & Plan:  Contraception- Mirena RTC 1 month for string check

## 2013-07-17 NOTE — Patient Instructions (Signed)
Colocación de un dispositivo intrauterino - Cuidados posteriores  (Intrauterine Device Insertion, Care After)  Siga estas instrucciones durante las próximas semanas. Estas indicaciones le proporcionan información general acerca de cómo deberá cuidarse después del procedimiento. El médico también podrá darle instrucciones más específicas. El tratamiento ha sido planificado según las prácticas médicas actuales, pero en algunos casos pueden ocurrir problemas. Comuníquese con el médico si tiene algún problema o tiene dudas después del procedimiento.  QUÉ ESPERAR DESPUÉS DEL PROCEDIMIENTO  La inserción del DIU puede causar molestias, como cólicos. que deberían mejorar una vez que el DIU esté en su lugar. Podrá tener sangrado después del procedimiento. Esto es normal. Varía desde un sangrado ligero durante un par de días hasta un sangrado similar al menstrual. Cuando el DIU esté en su lugar, se extenderá un hilo de 1 a 2 pulgadas (2,5 a 5 cm) por el cuello del útero en la vagina. El hilo no debería molestarle a usted ni a su pareja. De lo contrario, consulte con su médico.   INSTRUCCIONES PARA EL CUIDADO EN EL HOGAR   · Controle su DIU para asegurarse de que esté en su lugar, antes de reanudar la actividad sexual. Tiene que sentir los hilos. Si no los siente, algo puede estar mal. El DIU puede haberse salido del útero o éste puede haber sido atravesado (perforado) durante la colocación. Además, si los hilos son más largos, puede significar que el DIU se está saliendo del útero. Si ocurre alguno de estos problemas, no estará protegida y podrá quedar embarazada.  · Puede volver a tener relaciones sexuales si no tiene problemas con el DIU. El DIU de cobre se considera efectivo y funciona de inmediato, si se inserta dentro de los 7 días del inicio del período. Será necesario que utilice un método anticonceptivo adicional durante 7 días, si el DIU se inserta en algún otro momento del ciclo.  · Controle que el DIU sigue en su  lugar sintiendo los hilos después de cada período menstrual.  · Es posible que necesite tomar analgésicos, como acetaminofeno o ibuprofeno. Tome todos los medicamentos como le indicó el médico.  SOLICITE ATENCIÓN MÉDICA SI:   · Tiene un sangrado más abundante o dura más de un ciclo menstrual normal.  · Tiene fiebre.  · Siente cólicos o dolor abdominal que no se alivian con medicamentos.  · Siente dolor abdominal que no parece estar relacionado con el área en que sentía los cólicos y el dolor anteriormente.  · Se siente mareada, inusualmente débil o se desmaya.  · Tiene flujo vaginal u olores anormales.  · Siente dolor durante las relaciones sexuales.  · No puede sentir los hilos del DIU o los siente más largos.  · Siente que el DIU está en la abertura del cuello del útero, en la vagina.  · Piensa que está embarazada o no tiene su período menstrual.  · El hilo del DIU está lastimando a su pareja sexual.  ASEGÚRESE DE QUE:  · Comprende estas instrucciones.  · Controlará su afección.  · Recibirá ayuda de inmediato si no mejora o si empeora.  Document Released: 03/08/2012 Document Revised: 04/04/2013  ExitCare® Patient Information ©2014 [employer].

## 2013-08-14 ENCOUNTER — Ambulatory Visit: Payer: Self-pay | Admitting: Family Medicine

## 2014-02-04 ENCOUNTER — Ambulatory Visit (INDEPENDENT_AMBULATORY_CARE_PROVIDER_SITE_OTHER): Payer: Self-pay | Admitting: Obstetrics & Gynecology

## 2014-02-04 ENCOUNTER — Encounter: Payer: Self-pay | Admitting: Obstetrics & Gynecology

## 2014-02-04 VITALS — BP 117/76 | HR 95 | Resp 16 | Ht <= 58 in | Wt 188.0 lb

## 2014-02-04 DIAGNOSIS — Z113 Encounter for screening for infections with a predominantly sexual mode of transmission: Secondary | ICD-10-CM

## 2014-02-04 DIAGNOSIS — E669 Obesity, unspecified: Secondary | ICD-10-CM

## 2014-02-04 DIAGNOSIS — N938 Other specified abnormal uterine and vaginal bleeding: Secondary | ICD-10-CM

## 2014-02-04 DIAGNOSIS — R635 Abnormal weight gain: Secondary | ICD-10-CM

## 2014-02-04 DIAGNOSIS — N949 Unspecified condition associated with female genital organs and menstrual cycle: Secondary | ICD-10-CM

## 2014-02-04 MED ORDER — NORETHINDRONE ACETATE 5 MG PO TABS: 5.0000 mg | ORAL_TABLET | Freq: Every day | ORAL | Status: DC

## 2014-02-04 NOTE — Progress Notes (Signed)
   Subjective:    Patient ID: Ana Goodwin, female    DOB: 11/22/1980, 33 y.o.   MRN: [id number]  HPI This MH 33 yo P3 is here because she has had almost everyday vaginal bleeding which she describes as "heavy" for the last 3 months. She also complains of a 14 pound weight gain.    Review of Systems She does not want more children, but her husband does.     Objective:   Physical Exam Heart- rrr Abd- benign, obese Speculum exam reveals nulliparous cervix, strings seen Only dark brown discharge noted in vagina     Assessment & Plan:  Obesity and weight gain- I have advised exercise and diet.  I will check a TSH DUB- probably related to the Mirena. I will check a CBC and cervical cultures. If testing is negative, then I will recommend norethindrone 5 mg daily

## 2014-02-05 LAB — CBC
HEMATOCRIT: 38.1 % (ref 36.0–46.0)
HEMOGLOBIN: 12.8 g/dL (ref 12.0–15.0)
MCH: 28.8 pg (ref 26.0–34.0)
MCHC: 33.6 g/dL (ref 30.0–36.0)
MCV: 85.8 fL (ref 78.0–100.0)
Platelets: 253 10*3/uL (ref 150–400)
RBC: 4.44 MIL/uL (ref 3.87–5.11)
RDW: 13.9 % (ref 11.5–15.5)
WBC: 8.1 10*3/uL (ref 4.0–10.5)

## 2014-02-05 LAB — GC/CHLAMYDIA PROBE AMP
CT PROBE, AMP APTIMA: NEGATIVE
GC Probe RNA: NEGATIVE

## 2014-02-05 LAB — TSH: TSH: 1.041 u[IU]/mL (ref 0.350–4.500)

## 2014-04-29 ENCOUNTER — Encounter: Payer: Self-pay | Admitting: Obstetrics & Gynecology

## 2014-12-10 ENCOUNTER — Emergency Department (HOSPITAL_COMMUNITY)
Admission: EM | Admit: 2014-12-10 | Discharge: 2014-12-11 | Disposition: A | Discharge status: Home / Self Care | Payer: Self-pay | Attending: Emergency Medicine | Admitting: Emergency Medicine

## 2014-12-10 ENCOUNTER — Emergency Department (INDEPENDENT_AMBULATORY_CARE_PROVIDER_SITE_OTHER)
Admission: EM | Admit: 2014-12-10 | Discharge: 2014-12-10 | Disposition: A | Discharge status: Other Acute Inpatient Hospital | Payer: Self-pay | Source: Home / Self Care | Attending: Family Medicine | Admitting: Family Medicine

## 2014-12-10 ENCOUNTER — Encounter (HOSPITAL_COMMUNITY): Payer: Self-pay | Admitting: Emergency Medicine

## 2014-12-10 ENCOUNTER — Encounter (HOSPITAL_COMMUNITY): Payer: Self-pay | Admitting: *Deleted

## 2014-12-10 ENCOUNTER — Emergency Department (HOSPITAL_COMMUNITY): Payer: Self-pay

## 2014-12-10 DIAGNOSIS — Z8669 Personal history of other diseases of the nervous system and sense organs: Secondary | ICD-10-CM | POA: Insufficient documentation

## 2014-12-10 DIAGNOSIS — R1013 Epigastric pain: Secondary | ICD-10-CM

## 2014-12-10 DIAGNOSIS — Z872 Personal history of diseases of the skin and subcutaneous tissue: Secondary | ICD-10-CM | POA: Insufficient documentation

## 2014-12-10 DIAGNOSIS — R1011 Right upper quadrant pain: Secondary | ICD-10-CM

## 2014-12-10 DIAGNOSIS — R509 Fever, unspecified: Secondary | ICD-10-CM

## 2014-12-10 DIAGNOSIS — R197 Diarrhea, unspecified: Secondary | ICD-10-CM

## 2014-12-10 DIAGNOSIS — Z79899 Other long term (current) drug therapy: Secondary | ICD-10-CM | POA: Insufficient documentation

## 2014-12-10 DIAGNOSIS — R5081 Fever presenting with conditions classified elsewhere: Secondary | ICD-10-CM | POA: Insufficient documentation

## 2014-12-10 LAB — COMPREHENSIVE METABOLIC PANEL
ALT: 56 U/L — ABNORMAL HIGH (ref 14–54)
ANION GAP: 9 (ref 5–15)
AST: 42 U/L — ABNORMAL HIGH (ref 15–41)
Albumin: 3.4 g/dL — ABNORMAL LOW (ref 3.5–5.0)
Alkaline Phosphatase: 55 U/L (ref 38–126)
BUN: 11 mg/dL (ref 6–20)
CO2: 24 mmol/L (ref 22–32)
CREATININE: 0.5 mg/dL (ref 0.44–1.00)
Calcium: 8.4 mg/dL — ABNORMAL LOW (ref 8.9–10.3)
Chloride: 105 mmol/L (ref 101–111)
GFR calc non Af Amer: >60 mL/min (ref >60)
GLUCOSE: 105 mg/dL — AB (ref 65–99)
Potassium: 3.2 mmol/L — ABNORMAL LOW (ref 3.5–5.1)
Sodium: 138 mmol/L (ref 135–145)
TOTAL PROTEIN: 6.8 g/dL (ref 6.5–8.1)
Total Bilirubin: 1 mg/dL (ref 0.3–1.2)

## 2014-12-10 LAB — CBC WITH DIFFERENTIAL/PLATELET
Basophils Absolute: 0.2 10*3/uL — ABNORMAL HIGH (ref 0.0–0.1)
Basophils Relative: 5 % — ABNORMAL HIGH (ref 0–1)
EOS ABS: 0 10*3/uL (ref 0.0–0.7)
Eosinophils Relative: 1 % (ref 0–5)
HCT: 36.9 % (ref 36.0–46.0)
Hemoglobin: 13 g/dL (ref 12.0–15.0)
Lymphocytes Relative: 34 % (ref 12–46)
Lymphs Abs: 1.6 10*3/uL (ref 0.7–4.0)
MCH: 29.8 pg (ref 26.0–34.0)
MCHC: 35.2 g/dL (ref 30.0–36.0)
MCV: 84.6 fL (ref 78.0–100.0)
MONO ABS: 0.7 10*3/uL (ref 0.1–1.0)
Monocytes Relative: 14 % — ABNORMAL HIGH (ref 3–12)
NEUTROS ABS: 2.3 10*3/uL (ref 1.7–7.7)
NEUTROS PCT: 46 % (ref 43–77)
PLATELETS: 99 10*3/uL — AB (ref 150–400)
RBC: 4.36 MIL/uL (ref 3.87–5.11)
RDW: 13.2 % (ref 11.5–15.5)
WBC: 4.8 10*3/uL (ref 4.0–10.5)

## 2014-12-10 LAB — POCT URINALYSIS DIP (DEVICE)
BILIRUBIN URINE: NEGATIVE
Glucose, UA: NEGATIVE mg/dL
KETONES UR: NEGATIVE mg/dL
LEUKOCYTES UA: NEGATIVE
NITRITE: NEGATIVE
Protein, ur: 30 mg/dL — AB
Specific Gravity, Urine: >=1.03 (ref 1.005–1.030)
Urobilinogen, UA: 0.2 mg/dL (ref 0.0–1.0)
pH: 5.5 (ref 5.0–8.0)

## 2014-12-10 LAB — LIPASE, BLOOD: LIPASE: 20 U/L — AB (ref 22–51)

## 2014-12-10 LAB — POCT PREGNANCY, URINE: PREG TEST UR: NEGATIVE

## 2014-12-10 MED ORDER — FAMOTIDINE 20 MG PO TABS
20.0000 mg | ORAL_TABLET | Freq: Once | ORAL | Status: AC
  Administered 2014-12-10: 20 mg via ORAL
  Filled 2014-12-10: qty 1

## 2014-12-10 MED ORDER — FAMOTIDINE 20 MG PO TABS: 20.0000 mg | ORAL_TABLET | Freq: Two times a day (BID) | ORAL | Status: DC

## 2014-12-10 MED ORDER — GI COCKTAIL ~~LOC~~
30.0000 mL | Freq: Once | ORAL | Status: AC
  Administered 2014-12-10: 30 mL via ORAL
  Filled 2014-12-10: qty 30

## 2014-12-10 NOTE — ED Provider Notes (Signed)
CSN: [id number]     Arrival date & time 12/10/14  1318 History   First MD Initiated Contact with Patient 12/10/14 1408     Chief Complaint  Patient presents with  . Abdominal Pain   (Consider location/radiation/quality/duration/timing/severity/associated sxs/prior Treatment) Patient is a 34 y.o. female presenting with abdominal pain. The history is provided by the patient.  Abdominal Pain Pain location:  LUQ and RUQ Pain quality: burning   Pain radiates to:  Does not radiate Pain severity:  Moderate Onset quality:  Gradual Duration:  4 days Progression:  Worsening Chronicity:  New Context comment:  No menses since 8/15, after mirena iud  until sat when began with sx of fever, upper abd pain back pain and headache. Associated symptoms: diarrhea, fever, nausea, shortness of breath, vaginal bleeding and vomiting     Past Medical History  Diagnosis Date  . 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 Pain in lower back     2 yrs ago/seen at hospital/lasted one months to be able to stand alone and walk  . Ulcer of abdomen wall   . Headache(784.0)   . Neuromuscular disorder   . Morbid obesity    Past Surgical History  Procedure Laterality Date  . Cesarean section      X 2  . Cesarean section N/A 05/25/2013    Procedure: REPEAT CESAREAN SECTION LOW TRANSVERSE WITH VERTICAL ABDOMINAL INCISION;  Surgeon: Tilda Burrow, MD;  Location: [facility];  Service: Obstetrics;  Laterality: N/A;   Family History  Problem Relation Age of Onset  . Diabetes Mother   . Cancer Father     unsure of type   History  Substance Use Topics  . Smoking status: Never Smoker   . Smokeless tobacco: Never Used  . Alcohol Use: No   OB History    Gravida Para Term Preterm AB TAB SAB Ectopic Multiple Living   3 3 3  0 0 0 0 0 0 3     Review of Systems  Constitutional: Positive for fever.  Respiratory: Positive for shortness of breath.   Gastrointestinal: Positive for nausea, vomiting, abdominal  pain and diarrhea.  Genitourinary: Positive for vaginal bleeding.    Allergies  Review of patient's allergies indicates no known allergies.  Home Medications   Prior to Admission medications   Medication Sig Start Date End Date Taking? Authorizing Provider  norethindrone (AYGESTIN) 5 MG tablet Take 1 tablet (5 mg total) by mouth daily. 02/04/14   Allie Bossier, MD   BP 108/78 mmHg  Pulse 88  Temp(Src) 98 F (36.7 C) (Oral)  Resp 12  SpO2 98%  LMP 12/08/2014 Physical Exam  Constitutional: She is oriented to person, place, and time. She appears well-developed and well-nourished.  Abdominal: She exhibits distension. She exhibits no mass. Bowel sounds are decreased. There is no hepatosplenomegaly. There is tenderness in the right upper quadrant, epigastric area and left upper quadrant. There is no rigidity, no rebound, no guarding and no CVA tenderness.    Neurological: She is alert and oriented to person, place, and time.  Skin: Skin is warm and dry.  Nursing note and vitals reviewed.   ED Course  Procedures (including critical care time) Labs Review Labs Reviewed  POCT URINALYSIS DIP (DEVICE) - Abnormal; Notable for the following:    Hgb urine dipstick LARGE (*)    Protein, ur 30 (*)    All other components within normal limits  POCT PREGNANCY, URINE  Imaging Review No results found.   MDM   1. Epigastric pain    After discussion with [facility] will send to [facility] ER for further eval.    Linna Hoff, MD 12/10/14 [phone]

## 2014-12-10 NOTE — Discharge Instructions (Signed)
Dolor abdominal (Abdominal Pain) El dolor puede tener muchas causas. Normalmente la causa del dolor abdominal no es una enfermedad y Scientist, clinical (histocompatibility and immunogenetics) sin TEFL teacher. Frecuentemente puede controlarse y tratarse en casa. Su mdico le Medical sales representative examen fsico y posiblemente solicite anlisis de sangre y radiografas para ayudar a Chief Strategy Officer la gravedad de su dolor. Sin embargo, en [employer], debe transcurrir ms tiempo antes de que se pueda Clinical research associate una causa evidente del dolor. Antes de llegar a ese punto, es posible que su mdico no sepa si necesita ms pruebas o un tratamiento ms profundo. INSTRUCCIONES PARA EL CUIDADO EN EL HOGAR  Est atento al dolor para ver si hay cambios. Las siguientes indicaciones ayudarn a Architectural technologist que pueda sentir:  [place] solo medicamentos de venta libre o recetados, segn las indicaciones del mdico.  No tome laxantes a menos que se lo haya indicado su mdico.  Pruebe con [place]om[place] Dear dieta lquida absoluta (caldo, t o agua) segn se lo indique su mdico. Introduzca gradualmente una dieta normal, segn su tolerancia. SOLICITE ATENCIN MDICA SI:  Tiene dolor abdominal sin explicacin.  Tiene dolor abdominal relacionado con nuseas o diarrea.  Tiene dolor cuando orina o defeca.  Experimenta dolor abdominal que lo despierta de noche.  Tiene dolor abdominal que empeora o mejora cuando come alimentos.  Tiene dolor abdominal que empeora cuando come alimentos grasosos.  Tiene fiebre. SOLICITE ATENCIN MDICA DE INMEDIATO SI:   El dolor no desaparece en un plazo mximo de 2horas.  No deja de (vomitar).  El Engineer, mining se siente solo en partes del abdomen, como el lado derecho o la parte inferior izquierda del abdomen.  Evaca materia fecal sanguinolenta o negra, de aspecto alquitranado. ASEGRESE DE QUE:  Comprende estas instrucciones.  Controlar su afeccin.  Recibir ayuda de inmediato si no mejora o si empeora. Document Released: 06/14/2005  Document Revised: 06/19/2013 [facility] Patient Information 2015 [place], [place]. This information is not intended to replace advice given to you by your health care provider. Make sure you discuss any questions you have with your health care provider.  Trombocitopenia (Thrombocytopenia)  La trombocitopenia es un trastorno en el que hay un nmero de plaquetas en la sangre que es anormalmente bajo. Las plaquetas tambin se denominan trombocitos. Son necesarias para la coagulacin de la [place].  CAUSAS La trombocitopenia tiene su origen en:  La disminucin en la produccin de plaquetas. Las causas pueden ser: Anemia aplsica en la que la mdula sea deja de fabricar los componentes de la [place]. Cncer de la mdula sea. Ciertos medicamentos, incluyendo la quimioterapia. Infeccin de la mdula sea. Consumo excesivo de alcohol. El incremento en la destruccin de plaquetas. Las causas pueden ser: Materials engineer enfermedades del sistema digestivo. Uso de ciertos frmacos Ciertos trastornos de Doctor, hospital de Risk manager. Ciertos trastornos hereditarios. Ciertos trastornos hemorrgicos. Embarazo. Tener un bazo agrandado (hiperesplenismo). En el hiperesplenismo, el bazo toma las plaquetas de la circulacin. Esto significa que las plaquetas no estn disponibles para intervenir en la coagulacin de la [place]. El bazo puede agrandarse por cirrosis u otras enfermedades. SNTOMAS Todos los sntomas de trombocitopenia son esencialmente un efecto secundario de un dficit en la coagulacin de la [place]. Entre ellos se incluyen: Hemorragias anormales. Hemorragias nasales. Perodos menstruales abundantes. Sangre en la orina o en la materia fecal. Prpura: coloracin purprea de la piel producida por pequeas hemorragias de los vasos sanguneos cerca de la superficie de la piel. Hematomas. Una erupcin que puede ser petequial. Esto aparece como puntitos, manchas de color rojo violceo  sobre la piel y las  mucosas. La causa es un sangrado de los pequeos vasos sanguneos (capilares). DIAGNSTICO El Hydrologist un diagnstico basado en el examen e indicando estudios de [place]. En algunos casos se realiza un estudio de la mdula sea para observar las clulas originales (megacariocitos) que son los que forman las plaquetas. TRATAMIENTO El tratamiento depende de la causa. Podrn indicarle medicamentos para proteger las plaquetas y Automotive engineer que se destruyan. En algunos casos se realiza la reposicin de plaquetas (transfusin) para Adult nurse. A veces es necesario extirpar quirrgicamente el bazo. INSTRUCCIONES PARA EL CUIDADO DOMICILIARIO Controle su piel y el interior de su boca para ver si hay hematomas o sangre, segn las indicaciones de su mdico. Controle el esputo, la orina y las heces para ver si hay sangre, segn le indique su mdico. No vuelva a las actividades que puedan causar bultos o hematomas hasta que su mdico lo autorice. Tome precauciones extra para no cortarse cuando se rasure, use tijeras, agujas, cuchillos u otras herramientas. Tome precauciones extra para no sufrir quemaduras al Banker. Consulte a su mdico si puede beber alcohol. Tome slo medicamentos de venta libre o recetados, segn las indicaciones del mdico. Notifique a los profesionales de la salud, incluyendo dentistas u oftalmlogos acerca de su enfermedad. SOLICITE ATENCIN MDICA INMEDIATAMENTE SI: Tiene una hemorragia activa en cualquier lugar del cuerpo. Se le forman moretones o tiene sangrados sin causa aparente. Observa sangre en el esputo, la orina o las heces. ASEGRESE DE QUE:  Comprende estas instrucciones. Controlar su enfermedad. Solicitar ayuda de inmediato si no mejora o si empeora. Document Released: 09/30/2008 Document Revised: 09/06/2011 [facility] Patient Information 2015 [place], [place]. This information is not intended to replace advice given to you by your  health care provider. Make sure you discuss any questions you have with your health care provider.

## 2014-12-10 NOTE — ED Notes (Signed)
Pt in c/o upper abd pain for the last few days, went to urgent care for same and sent here for further evaluation, pt denies n/v/d, reports intermittent fever, none on arrival

## 2014-12-10 NOTE — ED Provider Notes (Signed)
CSN: [id number]     Arrival date & time 12/10/14  1548 History   First MD Initiated Contact with Patient 12/10/14 1836     Chief Complaint  Patient presents with  . Abdominal Pain     (Consider location/radiation/quality/duration/timing/severity/associated sxs/prior Treatment) HPI The patient developed abdominal pain that has been going on for about a week. She states that there has not been nausea or vomiting. She did have diarrhea 2 days ago but that has resolved. The patient has had fever as high as 103. The pain goes through to her back. There has not been urinary symptoms of pain burning or urgency. The patient has a history of hysterectomy but no other intra-abdominal surgery. Past Medical History  Diagnosis Date  . 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 Pain in lower back     2 yrs ago/seen at hospital/lasted one months to be able to stand alone and walk  . Ulcer of abdomen wall   . Headache(784.0)   . Neuromuscular disorder   . Morbid obesity    Past Surgical History  Procedure Laterality Date  . Cesarean section      X 2  . Cesarean section N/A 05/25/2013    Procedure: REPEAT CESAREAN SECTION LOW TRANSVERSE WITH VERTICAL ABDOMINAL INCISION;  Surgeon: Tilda Burrow, MD;  Location: [facility];  Service: Obstetrics;  Laterality: N/A;   Family History  Problem Relation Age of Onset  . Diabetes Mother   . Cancer Father     unsure of type   History  Substance Use Topics  . Smoking status: Never Smoker   . Smokeless tobacco: Never Used  . Alcohol Use: No   OB History    Gravida Para Term Preterm AB TAB SAB Ectopic Multiple Living   0 0 0 0 0 0 3     Review of Systems 10 Systems reviewed and are negative for acute change except as noted in the HPI.    Allergies  Review of patient's allergies indicates no known allergies.  Home Medications   Prior to Admission medications   Medication Sig Start Date End Date Taking? Authorizing Provider   acetaminophen (TYLENOL) 500 MG tablet Take 500 mg by mouth every 6 (six) hours as needed for mild pain or moderate pain.   Yes Historical Provider, MD  levonorgestrel (MIRENA) 20 MCG/24HR IUD 1 each by Intrauterine route once.   Yes Historical Provider, MD  famotidine (PEPCID) 20 MG tablet Take 1 tablet (20 mg total) by mouth 2 (two) times daily. 12/10/14   Arby Barrette, MD  norethindrone (AYGESTIN) 5 MG tablet Take 1 tablet (5 mg total) by mouth daily. Patient not taking: Reported on 12/10/2014 02/04/14   Allie Bossier, MD   BP 117/78 mmHg  Pulse 91  Temp(Src) 98.4 F (36.9 C) (Oral)  Resp 20  Wt 188 lb (85.276 kg)  SpO2 98%  LMP 12/08/2014 Physical Exam  Constitutional: She is oriented to person, place, and time. She appears well-developed and well-nourished.  HENT:  Head: Normocephalic and atraumatic.  Eyes: EOM are normal. Pupils are equal, round, and reactive to light.  Neck: Neck supple.  Cardiovascular: Normal rate, regular rhythm, normal heart sounds and intact distal pulses.   Pulmonary/Chest: Effort normal and breath sounds normal.  Abdominal: Soft. Bowel sounds are normal. She exhibits no distension. There is tenderness.  Moderate to severe tenderness in the epigastrium. Moderate tenderness right upper quadrant. No guarding or rebound.  Lower abdomen is soft and nontender.  Musculoskeletal: Normal range of motion. She exhibits no edema.  Neurological: She is alert and oriented to person, place, and time. She has normal strength. Coordination normal. GCS eye subscore is 4. GCS verbal subscore is 5. GCS motor subscore is 6.  Skin: Skin is warm, dry and intact.  Psychiatric: She has a normal mood and affect.    ED Course  Procedures (including critical care time) Labs Review Labs Reviewed  CBC WITH DIFFERENTIAL/PLATELET  COMPREHENSIVE METABOLIC PANEL  LIPASE, BLOOD    Imaging Review US Abdomen Limited Ruq  12/10/2014   CLINICAL DATA:  Right upper quadrant/ epigastric  pain  EXAM: US ABDOMEN LIMITED - RIGHT UPPER QUADRANT  COMPARISON:  None.  FINDINGS: Gallbladder:  No gallstones or wall thickening visualized. There is no pericholecystic fluid. No sonographic Murphy sign noted.  Common bile duct:  Diameter: 6 mm. There is no intrahepatic or extrahepatic biliary duct dilatation.  Liver:  No focal lesion identified. Liver echogenicity is diffusely increased. There may be mild fatty sparing near the gallbladder fossa.  IMPRESSION: Diffuse increased liver echogenicity, most likely due to hepatic steatosis. There may be mild fatty sparing near the gallbladder fossa. While no focal liver lesions are identified, it must be cautioned that the sensitivity of ultrasound for focal liver lesions is diminished in this circumstance. Study otherwise unremarkable.   Electronically Signed   By: Bretta Bang III M.D.   On: 12/10/2014 20:11     EKG Interpretation None      MDM   Final diagnoses:  Epigastric pain  Diarrhea  Other specified fever   The patient is with upper abdominal pain. In association with this she has had diarrhea and fever although the diarrhea has now improved. Concern was for possible cholecystitis with epigastric and upper quadrant pain. Ultrasound does not show any duct dilation or gallbladder inflammation. She did experience significant improvement with GI cocktail and Pepcid. At this point she will be treated for gastritis that is suspected to be viral in etiology with instructions to return if worsening or changing symptoms.    Arby Barrette, MD 12/13/14 1723

## 2014-12-10 NOTE — ED Notes (Signed)
Pt states that she has had abdominal pain since Saturday. Pt states "I feel inflammed"

## 2014-12-10 NOTE — Discharge Instructions (Signed)
[facility] abdominal [facility], nothing to eat or drink until seen.

## 2014-12-10 NOTE — ED Notes (Signed)
Patient C/O epigastric pain that has been ongoing for 1 week.  States that the pain goes through to her back.  Eating does not changes the pain.  Epigastrum is tender to palpation. Denies nausea or vomiting.  States that she has diarrhea last Sunday but none since.  Patient was febrile Saturday and Sunday.  States that she was seen at urgent care and sent to [facility] ED.

## 2014-12-11 LAB — PATHOLOGIST SMEAR REVIEW

## 2015-01-07 ENCOUNTER — Other Ambulatory Visit: Payer: Self-pay | Admitting: "Women's Health Care

## 2015-01-07 DIAGNOSIS — N63 Unspecified lump in unspecified breast: Secondary | ICD-10-CM

## 2015-01-15 ENCOUNTER — Ambulatory Visit
Admission: RE | Admit: 2015-01-15 | Discharge: 2015-01-15 | Disposition: A | Discharge status: Home / Self Care | Payer: Self-pay | Source: Ambulatory Visit | Attending: "Women's Health Care | Admitting: "Women's Health Care

## 2015-01-15 ENCOUNTER — Other Ambulatory Visit: Payer: Self-pay | Admitting: "Women's Health Care

## 2015-01-15 ENCOUNTER — Other Ambulatory Visit: Payer: Self-pay

## 2015-01-15 DIAGNOSIS — N63 Unspecified lump in unspecified breast: Secondary | ICD-10-CM

## 2015-06-10 ENCOUNTER — Other Ambulatory Visit: Payer: Self-pay | Admitting: "Women's Health Care

## 2015-06-10 DIAGNOSIS — R921 Mammographic calcification found on diagnostic imaging of breast: Secondary | ICD-10-CM

## 2015-07-21 ENCOUNTER — Ambulatory Visit
Admission: RE | Admit: 2015-07-21 | Discharge: 2015-07-21 | Disposition: A | Discharge status: Home / Self Care | Payer: Self-pay | Source: Ambulatory Visit | Attending: "Women's Health Care | Admitting: "Women's Health Care

## 2015-07-21 DIAGNOSIS — R921 Mammographic calcification found on diagnostic imaging of breast: Secondary | ICD-10-CM

## 2016-01-05 ENCOUNTER — Other Ambulatory Visit: Payer: Self-pay | Admitting: "Women's Health Care

## 2016-01-05 DIAGNOSIS — R921 Mammographic calcification found on diagnostic imaging of breast: Secondary | ICD-10-CM

## 2016-01-19 ENCOUNTER — Other Ambulatory Visit: Payer: Self-pay | Admitting: "Women's Health Care

## 2016-01-19 ENCOUNTER — Ambulatory Visit
Admission: RE | Admit: 2016-01-19 | Discharge: 2016-01-19 | Disposition: A | Discharge status: Home / Self Care | Payer: No Typology Code available for payment source | Source: Ambulatory Visit | Attending: "Women's Health Care | Admitting: "Women's Health Care

## 2016-01-19 DIAGNOSIS — R921 Mammographic calcification found on diagnostic imaging of breast: Secondary | ICD-10-CM

## 2017-03-20 ENCOUNTER — Encounter (HOSPITAL_COMMUNITY): Payer: Self-pay

## 2017-03-20 ENCOUNTER — Emergency Department (HOSPITAL_COMMUNITY)
Admission: EM | Admit: 2017-03-20 | Discharge: 2017-03-21 | Disposition: A | Discharge status: Home / Self Care | Payer: Self-pay | Attending: Emergency Medicine | Admitting: Emergency Medicine

## 2017-03-20 ENCOUNTER — Emergency Department (HOSPITAL_COMMUNITY): Payer: Self-pay

## 2017-03-20 DIAGNOSIS — R0789 Other chest pain: Secondary | ICD-10-CM | POA: Insufficient documentation

## 2017-03-20 DIAGNOSIS — Z79899 Other long term (current) drug therapy: Secondary | ICD-10-CM | POA: Insufficient documentation

## 2017-03-20 DIAGNOSIS — R05 Cough: Secondary | ICD-10-CM | POA: Insufficient documentation

## 2017-03-20 DIAGNOSIS — R059 Cough, unspecified: Secondary | ICD-10-CM

## 2017-03-20 DIAGNOSIS — J029 Acute pharyngitis, unspecified: Secondary | ICD-10-CM | POA: Insufficient documentation

## 2017-03-20 LAB — RAPID STREP SCREEN (MED CTR MEBANE ONLY): Streptococcus, Group A Screen (Direct): NEGATIVE

## 2017-03-20 NOTE — ED Triage Notes (Signed)
Pt seen at [facility] u/c 03-17-17 dx with Influenza A, prescribed Tamiflu, started med 03-17-17.  Now cough is worse causing chest to hurt and throat is dry.

## 2017-03-20 NOTE — ED Notes (Signed)
Patient transported to X-ray 

## 2017-03-20 NOTE — ED Provider Notes (Signed)
[facility]-EMERGENCY DEPT Provider Note   CSN: [id number] Arrival date & time: 03/20/17  2129     History   Chief Complaint Chief Complaint  Patient presents with  . Cough    HPI Ana Goodwin is a 36 y.o. female.  The history is provided by the patient and medical records.  Cough  Associated symptoms include sore throat.     36 year old female with history of headaches, low back pain, presenting to the ED for sore throat, cough, and chest discomfort. Patient reports [facility] was [facility] ago and diagnosed with influenza A by nasal swab test. She was started on Tamiflu. Reports since then she feels her symptoms are worsening. States her throat is very sore, dry, and scratchy feeling. Reports is very painful for her to swallow. States she has productive cough with clear sputum. When she coughs she feels discomfort in her chest, otherwise no pain. No shortness of breath. Had a fever a few days ago, none since then. She is just concerned that she may have a secondary infection on top of the flu.  Past Medical History:  Diagnosis Date  . 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 Headache(784.0)   . Morbid obesity (HCC)   . Neuromuscular disorder (HCC)   . Pain in lower back    2 yrs ago/seen at hospital/lasted one months to be able to stand alone and walk  . Ulcer of abdomen wall [facility])     Patient Active Problem List   Diagnosis Date Noted  . Morbid obesity (HCC)   . Obesity 06/05/2013  . Language barrier (Spanish only, needs interpreter) 02/21/2013  . Supervision of other normal pregnancy 10/30/2012  . Previous cesarean delivery, antepartum condition or complication 10/30/2012    Past Surgical History:  Procedure Laterality Date  . CESAREAN SECTION     X 2  . CESAREAN SECTION N/A 05/25/2013   Procedure: REPEAT CESAREAN SECTION LOW TRANSVERSE WITH VERTICAL ABDOMINAL INCISION;  Surgeon: Tilda Burrow, MD;  Location: [facility];  Service: Obstetrics;   Laterality: N/A;    OB History    Gravida Para Term Preterm AB Living   0 0 3   SAB TAB Ectopic Multiple Live Births   0 0 0 0 3       Home Medications    Prior to Admission medications   Medication Sig Start Date End Date Taking? Authorizing Provider  acetaminophen (TYLENOL) 500 MG tablet Take 500 mg by mouth every 6 (six) hours as needed for mild pain or moderate pain.    [provider]  famotidine (PEPCID) 20 MG tablet Take 1 tablet (20 mg total) by mouth 2 (two) times daily. 12/10/14   Arby Barrette, MD  levonorgestrel (MIRENA) 20 MCG/24HR IUD 1 each by Intrauterine route once.    [provider]  norethindrone (AYGESTIN) 5 MG tablet Take 1 tablet (5 mg total) by mouth daily. Patient not taking: Reported on 12/10/2014 02/04/14   Allie Bossier, MD    Family History Family History  Problem Relation Age of Onset  . Diabetes Mother   . Cancer Father        unsure of type    Social History Social History  Substance Use Topics  . Smoking status: Never Smoker  . Smokeless tobacco: Never Used  . Alcohol use No     Allergies   Patient has no known allergies.   Review of  Systems Review of Systems  HENT: Positive for sore throat.   Respiratory: Positive for cough.   All other systems reviewed and are negative.    Physical Exam Updated Vital Signs BP 117/71   Pulse 96   Temp 98.1 F (36.7 C) (Oral)   Resp 16   Ht  (1.422 m)   Wt 85.3 kg (188 lb)   SpO2 99%   BMI 42.15 kg/m   Physical Exam  Constitutional: She is oriented to person, place, and time. She appears well-developed and well-nourished.  HENT:  Head: Normocephalic and atraumatic.  Mouth/Throat: Oropharynx is clear and moist.  Tonsils with mild erythema bilaterally but no exudate; uvula midline without evidence of peritonsillar abscess; handling secretions appropriately; no difficulty swallowing or speaking; normal phonation without stridor  Eyes: Pupils are equal,  round, and reactive to light. Conjunctivae and EOM are normal.  Neck: Normal range of motion.  Cardiovascular: Normal rate, regular rhythm and normal heart sounds.   Pulmonary/Chest: Effort normal and breath sounds normal. She has no wheezes. She has no rhonchi.  Dry, hacking cough noted during exam, lungs overall clear  Abdominal: Soft. Bowel sounds are normal.  Musculoskeletal: Normal range of motion.  Neurological: She is alert and oriented to person, place, and time.  Skin: Skin is warm and dry.  Psychiatric: She has a normal mood and affect.  Nursing note and vitals reviewed.    ED Treatments / Results  Labs (all labs ordered are listed, but only abnormal results are displayed) Labs Reviewed  RAPID STREP SCREEN (NOT AT [facility])  CULTURE, GROUP A STREP [facility])    EKG  EKG Interpretation None       Radiology Dg Chest 2 View  Result Date: 03/21/2017 CLINICAL DATA:  Cough, mid chest pain and sore throat. EXAM: CHEST  2 VIEW COMPARISON:  None. FINDINGS: Cardiomediastinal silhouette is normal. No pleural effusions or focal consolidations. Mild bronchitic changes. Trachea projects midline and there is no pneumothorax. Soft tissue planes and included osseous structures are non-suspicious. Bilateral proximal humerus exostoses. IMPRESSION: Mild bronchitic changes without focal consolidation. Electronically Signed   By: Awilda Metro M.D.   On: 03/21/2017 00:15    Procedures Procedures (including critical care time)  Medications Ordered in ED Medications - No data to display   Initial Impression / Assessment and Plan / ED Course  I have reviewed the triage vital signs and the nursing notes.  Pertinent labs & imaging results that were available during my care of the patient were reviewed by me and considered in my medical decision making (see chart for details).  36 year old female here with cough, sore throat, chest discomfort with coughing. She was diagnosed with the flu a  few days ago. She is afebrile and nontoxic. Tonsils are slightly erythematous but no edema or exudates. No signs of peritonsillar abscess. She does have a dry, hacking cough on exam but lungs are clear without wheezes or rhonchi.  Rapid strep is negative, culture pending. Chest x-ray was obtained, mild bronchitic changes noted but no focal pneumonia. Suspect this is related to her influenza. Will start on prednisone taper, Tessalon, and give albuterol inhaler if needed.  Can follow-up with PCP if any ongoing issues.  Discussed plan with patient, she acknowledged understanding and agreed with plan of care.  Return precautions given for new or worsening symptoms.  Final Clinical Impressions(s) / ED Diagnoses   Final diagnoses:  Cough  Sore throat    New Prescriptions New Prescriptions  ALBUTEROL (PROVENTIL HFA;VENTOLIN HFA) 108 (90 BASE) MCG/ACT INHALER    Inhale 1-2 puffs into the lungs every 6 (six) hours as needed for wheezing.   BENZONATATE (TESSALON) 100 MG CAPSULE    Take 1 capsule (100 mg total) by mouth every 8 (eight) hours.   PREDNISONE (DELTASONE) 20 MG TABLET    Take 40 mg by mouth daily for 3 days, then  by mouth daily for 3 days, then  daily for 3 days     Garlon Hatchet, PA-C 03/21/17 0027    Lavera Guise, MD 03/21/17 [phone]

## 2017-03-21 MED ORDER — PREDNISONE 20 MG PO TABS: ORAL_TABLET | ORAL | 0 refills | Status: DC

## 2017-03-21 MED ORDER — BENZONATATE 100 MG PO CAPS: 100.0000 mg | ORAL_CAPSULE | Freq: Three times a day (TID) | ORAL | 0 refills | Status: DC

## 2017-03-21 MED ORDER — ALBUTEROL SULFATE HFA 108 (90 BASE) MCG/ACT IN AERS: 1.0000 | INHALATION_SPRAY | Freq: Four times a day (QID) | RESPIRATORY_TRACT | 0 refills | Status: DC | PRN

## 2017-03-21 NOTE — Discharge Instructions (Signed)
Take the prescribed medication as directed.  Continue your tamiflu.  Can drink warm tea, cold liquids, popsicles, etc to help ease throat pain.  Can also use lozenges or over the counter chloraseptic spray. Follow-up with your primary care doctor if any ongoing issues. Return to the ED for new or worsening symptoms.

## 2017-03-23 LAB — CULTURE, GROUP A STREP (THRC)

## 2017-03-31 ENCOUNTER — Ambulatory Visit (HOSPITAL_COMMUNITY)
Admission: EM | Admit: 2017-03-31 | Discharge: 2017-03-31 | Disposition: A | Discharge status: Home / Self Care | Payer: Self-pay | Attending: Family Medicine | Admitting: Family Medicine

## 2017-03-31 ENCOUNTER — Encounter (HOSPITAL_COMMUNITY): Payer: Self-pay | Admitting: Family Medicine

## 2017-03-31 DIAGNOSIS — J069 Acute upper respiratory infection, unspecified: Secondary | ICD-10-CM

## 2017-03-31 MED ORDER — CHLORHEXIDINE GLUCONATE 0.12 % MT SOLN: 15.0000 mL | Freq: Two times a day (BID) | OROMUCOSAL | 0 refills | Status: DC

## 2017-03-31 MED ORDER — PREDNISONE 20 MG PO TABS: ORAL_TABLET | ORAL | 0 refills | Status: DC

## 2017-03-31 NOTE — ED Provider Notes (Signed)
[facility]   [id number] 03/31/17 Arrival Time: 1543   SUBJECTIVE:  Ana Goodwin is a 36 y.o. female who presents to the urgent care with complaint of sore throat, laryngitis and still not feeling well. Reports treated for the flu and bronchitis in the past few weeks. Finished taking abx and inhaler.   Mouth feels dry.  Still coughing some in the morning.  Throat feels slightly swollen.  No fevers.     Past Medical History:  Diagnosis Date  . 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 Headache(784.0)   . Morbid obesity (HCC)   . Neuromuscular disorder (HCC)   . Pain in lower back    2 yrs ago/seen at hospital/lasted one months to be able to stand alone and walk  . Ulcer of abdomen wall (HCC)    Family History  Problem Relation Age of Onset  . Diabetes Mother   . Cancer Father        unsure of type   Social History   Social History  . Marital status: Significant Other    Spouse name: N/A  . Number of children: N/A  . Years of education: N/A   Occupational History  . Not on file.   Social History Main Topics  . Smoking status: Never Smoker  . Smokeless tobacco: Never Used  . Alcohol use No  . Drug use: No  . Sexual activity: Yes    Birth control/ protection: IUD   Other Topics Concern  . Not on file   Social History Narrative  . No narrative on file   No outpatient prescriptions have been marked as taking for the 03/31/17 encounter [facility] Encounter).   No Known Allergies    ROS: As per HPI, remainder of ROS negative.   OBJECTIVE:   Vitals:   03/31/17 1635  BP: 108/68  Pulse: 80  Resp: 18  Temp: 98.5 F (36.9 C)  SpO2: 100%     General appearance: alert; no distress Eyes: PERRL; EOMI; conjunctiva normal HENT: normocephalic; atraumatic; TMs normal, canal normal, external ears normal without trauma; nasal mucosa normal; oral mucosa normal Neck: supple Lungs: clear to auscultation bilaterally Heart: regular  rate and rhythm Back: no CVA tenderness Extremities: no cyanosis or edema; symmetrical with no gross deformities Skin: warm and dry Neurologic: normal gait; grossly normal Psychological: alert and cooperative; normal mood and affect      Labs:  Results for orders placed or performed during the hospital encounter of 03/20/17  Rapid strep screen  Result Value Ref Range   Streptococcus, Group A Screen (Direct) NEGATIVE NEGATIVE  Culture, group A strep  Result Value Ref Range   Specimen Description THROAT    Special Requests NONE Reflexed from [id number]    Culture FEW STREPTOCOCCUS,BETA HEMOLYTIC NOT GROUP A    Report Status 03/23/2017 FINAL     Labs Reviewed - No data to display  No results found.     ASSESSMENT & PLAN:  1. Viral upper respiratory tract infection     Meds ordered this encounter  Medications  . predniSONE (DELTASONE) 20 MG tablet    Sig: Two daily with food    Dispense:  6 tablet    Refill:  0  . chlorhexidine (PERIDEX) 0.12 % solution    Sig: Use as directed 15 mLs in the mouth or throat 2 (two) times daily.    Dispense:  120 mL    Refill:  0  Reviewed expectations re: course of current medical issues. Questions answered. Outlined signs and symptoms indicating need for more acute intervention. Patient verbalized understanding. After Visit Summary given.    Procedures:      Elvina Sidle, MD 03/31/17 1651

## 2017-03-31 NOTE — ED Triage Notes (Signed)
Pt here with sore throat, laryngitis and still not feeling well. Reports treated for the flu and bronchitis in the past few weeks. Finished taking abx and inhaler.

## 2018-06-18 ENCOUNTER — Emergency Department (HOSPITAL_COMMUNITY): Payer: Self-pay

## 2018-06-18 ENCOUNTER — Encounter (HOSPITAL_COMMUNITY): Payer: Self-pay | Admitting: Emergency Medicine

## 2018-06-18 ENCOUNTER — Emergency Department (HOSPITAL_COMMUNITY)
Admission: EM | Admit: 2018-06-18 | Discharge: 2018-06-18 | Disposition: A | Discharge status: Home / Self Care | Payer: Self-pay | Attending: Emergency Medicine | Admitting: Emergency Medicine

## 2018-06-18 DIAGNOSIS — B9789 Other viral agents as the cause of diseases classified elsewhere: Secondary | ICD-10-CM

## 2018-06-18 DIAGNOSIS — J029 Acute pharyngitis, unspecified: Secondary | ICD-10-CM | POA: Insufficient documentation

## 2018-06-18 DIAGNOSIS — R110 Nausea: Secondary | ICD-10-CM | POA: Insufficient documentation

## 2018-06-18 DIAGNOSIS — H9203 Otalgia, bilateral: Secondary | ICD-10-CM | POA: Insufficient documentation

## 2018-06-18 DIAGNOSIS — J069 Acute upper respiratory infection, unspecified: Secondary | ICD-10-CM | POA: Insufficient documentation

## 2018-06-18 LAB — GROUP A STREP BY PCR: Group A Strep by PCR: NOT DETECTED

## 2018-06-18 MED ORDER — CETIRIZINE-PSEUDOEPHEDRINE ER 5-120 MG PO TB12: 1.0000 | ORAL_TABLET | Freq: Every day | ORAL | 0 refills | Status: AC

## 2018-06-18 MED ORDER — BENZONATATE 100 MG PO CAPS: 100.0000 mg | ORAL_CAPSULE | Freq: Three times a day (TID) | ORAL | 0 refills | Status: AC

## 2018-06-18 NOTE — ED Triage Notes (Signed)
Since Tuesday having sore throat and little ear pain.

## 2018-06-18 NOTE — ED Provider Notes (Signed)
[facility]-EMERGENCY DEPT Provider Note   CSN: [id number] Arrival date & time: 06/18/18  0800     History   Chief Complaint Chief Complaint  Patient presents with  . Sore Throat    HPI Ana Goodwin is a 37 y.o. female with history of neuromuscular disorder who presents with a 6-day history of cough, sore throat, and bilateral intermittent ear pain.  Patient has been taking over-the-counter medications without significant relief.  She denies any shortness of breath, but has had some pain in her chest with coughing.  She reports her coughing seems to be improving, but her throat is very sore and dry.  She reports that she had a fever the first day, but has not had one since.  She denies any abdominal pain or vomiting.  She has had some nausea.  HPI  Past Medical History:  Diagnosis Date  . 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 Headache(784.0)   . Morbid obesity (HCC)   . Neuromuscular disorder (HCC)   . Pain in lower back    2 yrs ago/seen at hospital/lasted one months to be able to stand alone and walk  . Ulcer of abdomen wall [facility](HCC)     Patient Active Problem List   Diagnosis Date Noted  . Morbid obesity (HCC)   . Obesity 06/05/2013  . Language barrier (Spanish only, needs interpreter) 02/21/2013  . Supervision of other normal pregnancy 10/30/2012  . Previous cesarean delivery, antepartum condition or complication 10/30/2012    Past Surgical History:  Procedure Laterality Date  . CESAREAN SECTION     X 2  . CESAREAN SECTION N/A 05/25/2013   Procedure: REPEAT CESAREAN SECTION LOW TRANSVERSE WITH VERTICAL ABDOMINAL INCISION;  Surgeon: Tilda BurrowJohn V Ferguson, MD;  Location: [facility];  Service: Obstetrics;  Laterality: N/A;     OB History    Gravida  3   Para  3   Term  3   Preterm  0   AB  0   Living  3     SAB  0   TAB  0   Ectopic  0   Multiple  0   Live Births  3            Home Medications    Prior to  Admission medications   Medication Sig Start Date End Date Taking? Authorizing Provider  acetaminophen (TYLENOL) 500 MG tablet Take 500 mg by mouth every 6 (six) hours as needed for mild pain or moderate pain.   Yes [provider]  benzonatate (TESSALON) 100 MG capsule Take 1 capsule (100 mg total) by mouth every 8 (eight) hours. 06/18/18   Allison Silva, Waylan BogaAlexandra M, PA-C  cetirizine-pseudoephedrine (ZYRTEC-D) 5-120 MG tablet Take 1 tablet by mouth daily. 06/18/18   Emi HolesLaw, Riannah Stagner M, PA-C    Family History Family History  Problem Relation Age of Onset  . Diabetes Mother   . Cancer Father        unsure of type    Social History Social History   Tobacco Use  . Smoking status: Never Smoker  . Smokeless tobacco: Never Used  Substance Use Topics  . Alcohol use: No  . Drug use: No     Allergies   Patient has no known allergies.   Review of Systems Review of Systems  Constitutional: Positive for fever (resolved).  HENT: Positive for congestion, ear pain and sore throat.   Respiratory: Positive for cough. Negative for  shortness of breath.   Cardiovascular: Positive for chest pain (with cough).  Gastrointestinal: Positive for nausea. Negative for abdominal pain and vomiting.     Physical Exam Updated Vital Signs BP 123/76 (BP Location: Right Arm)   Pulse 79   Temp 98.3 F (36.8 C) (Oral)   Resp 19   SpO2 99%   Physical Exam Vitals signs and nursing note reviewed.  Constitutional:      General: She is not in acute distress.    Appearance: She is well-developed. She is not diaphoretic.  HENT:     Head: Normocephalic and atraumatic.     Right Ear: Tympanic membrane normal.     Left Ear: Tympanic membrane normal.     Nose: Congestion present.     Mouth/Throat:     Mouth: Mucous membranes are moist.     Pharynx: Posterior oropharyngeal erythema present. No oropharyngeal exudate.     Tonsils: No tonsillar exudate or tonsillar abscesses. Swelling: 1+ on the right. 1+  on the left.  Eyes:     General: No scleral icterus.       Right eye: No discharge.        Left eye: No discharge.     Conjunctiva/sclera: Conjunctivae normal.     Pupils: Pupils are equal, round, and reactive to light.  Neck:     Musculoskeletal: Normal range of motion and neck supple.     Thyroid: No thyromegaly.  Cardiovascular:     Rate and Rhythm: Normal rate and regular rhythm.     Heart sounds: Normal heart sounds. No murmur. No friction rub. No gallop.   Pulmonary:     Effort: Pulmonary effort is normal. No respiratory distress.     Breath sounds: Normal breath sounds. No stridor. No wheezing or rales.  Abdominal:     General: Bowel sounds are normal. There is no distension.     Palpations: Abdomen is soft.     Tenderness: There is no abdominal tenderness. There is no guarding or rebound.  Lymphadenopathy:     Cervical: No cervical adenopathy.  Skin:    General: Skin is warm and dry.     Coloration: Skin is not pale.     Findings: No rash.  Neurological:     Mental Status: She is alert.     Coordination: Coordination normal.      ED Treatments / Results  Labs (all labs ordered are listed, but only abnormal results are displayed) Labs Reviewed  GROUP A STREP BY PCR    EKG None  Radiology Dg Chest 2 View  Result Date: 06/18/2018 CLINICAL DATA:  Since Tuesday having sore throat and little ear pain. History of bronchitis. Cough x 6 days, chest pain with coughing EXAM: CHEST - 2 VIEW COMPARISON:  Radiograph 03/20/2017 FINDINGS: Normal mediastinum and cardiac silhouette. Normal pulmonary vasculature. No evidence of effusion, infiltrate, or pneumothorax. No acute bony abnormality. IMPRESSION: No acute cardiopulmonary process. Electronically Signed   By: Genevive BiStewart  Edmunds M.D.   On: 06/18/2018 08:45    Procedures Procedures (including critical care time)  Medications Ordered in ED Medications - No data to display   Initial Impression / Assessment and Plan / ED  Course  I have reviewed the triage vital signs and the nursing notes.  Pertinent labs & imaging results that were available during my care of the patient were reviewed by me and considered in my medical decision making (see chart for details).     Pt symptoms consistent with URI.  CXR negative for acute infiltrate.  Strep PCR is negative.  Patient is very well-appearing with stable vitals.  Pt will be discharged with symptomatic treatment.  Discussed return precautions.  Patient understands and agrees with plan.  Patient vital stable throughout ED course and discharged in satisfactory condition.   Final Clinical Impressions(s) / ED Diagnoses   Final diagnoses:  Viral URI with cough  Viral pharyngitis    ED Discharge Orders         Ordered    benzonatate (TESSALON) 100 MG capsule  Every 8 hours     06/18/18 0929    cetirizine-pseudoephedrine (ZYRTEC-D) 5-120 MG tablet  Daily     06/18/18 0929           Emi Holes, PA-C 06/18/18 0932    Pricilla Loveless, MD 06/18/18 [phone]

## 2018-06-18 NOTE — Discharge Instructions (Signed)
Take Tessalon every 8 hours as needed for cough.  Take Zyrtec-D as needed for nasal congestion and ear fullness.  You can alternate ibuprofen and Tylenol as prescribed over-the-counter, as needed for sore throat and ear pain.  Make sure to stay well-hydrated.  Please return to the emergency department if you develop any new or worsening symptoms.

## 2018-09-28 ENCOUNTER — Other Ambulatory Visit (HOSPITAL_COMMUNITY): Payer: Self-pay | Admitting: *Deleted

## 2018-09-28 DIAGNOSIS — N63 Unspecified lump in unspecified breast: Secondary | ICD-10-CM

## 2018-10-02 ENCOUNTER — Encounter (HOSPITAL_COMMUNITY): Payer: Self-pay | Admitting: *Deleted

## 2018-10-02 DIAGNOSIS — N63 Unspecified lump in unspecified breast: Secondary | ICD-10-CM

## 2018-10-04 ENCOUNTER — Telehealth (HOSPITAL_COMMUNITY): Payer: Self-pay | Admitting: *Deleted

## 2018-10-04 NOTE — Telephone Encounter (Signed)
Telephoned patient at home number and confirmed appointment April 9. No symptoms of COVID-19. No contact with someone with a confirmed diagnosis of COVID-19. No travel outside the state of [place] in the past 14 days. Used interpreter Delorise Royals.

## 2018-10-05 ENCOUNTER — Encounter (HOSPITAL_COMMUNITY): Payer: Self-pay

## 2018-10-05 ENCOUNTER — Ambulatory Visit (HOSPITAL_COMMUNITY)
Admission: RE | Admit: 2018-10-05 | Discharge: 2018-10-05 | Disposition: A | Discharge status: Home / Self Care | Payer: Self-pay | Source: Ambulatory Visit | Attending: Obstetrics and Gynecology | Admitting: Obstetrics and Gynecology

## 2018-10-05 ENCOUNTER — Other Ambulatory Visit: Payer: Self-pay

## 2018-10-05 VITALS — BP 116/74 | Temp 98.3°F | Wt 199.0 lb

## 2018-10-05 DIAGNOSIS — Z1239 Encounter for other screening for malignant neoplasm of breast: Secondary | ICD-10-CM

## 2018-10-05 DIAGNOSIS — N644 Mastodynia: Secondary | ICD-10-CM

## 2018-10-05 NOTE — Patient Instructions (Signed)
Explained breast self awareness with Tobie Poet. Patient did not need a Pap smear today due to last Pap smear was 08/31/2017. Let her know [facility] will cover Pap smears every 3 years unless has a history of abnormal Pap smears. Referred patient to the [facility] for a diagnostic mammogram. Appointment scheduled for Friday, October 13, 2018 at 0840. Patient aware of appointment and will be there. Tobie Poet verbalized understanding.  Antonia Culbertson, Kathaleen Maser, RN 2:44 PM

## 2018-10-05 NOTE — Progress Notes (Signed)
Complaints of left lower breast pain since December 2019 that comes and goes. Patient rates the pain at a 6-10 out of 10.  Pap Smear: Pap smear not completed today. Last Pap smear was 08/31/2017 at the [facility] and normal. Per patient has no history of an abnormal Pap smear. Last Pap smear result is in Epic.  Physical exam: Breasts Breasts symmetrical. No skin abnormalities bilateral breasts. No nipple retraction bilateral breasts. No nipple discharge bilateral breasts. No lymphadenopathy. No lumps palpated bilateral breasts. No complaints of pain or tenderness on exam. Referred patient to the [facility] for a diagnostic mammogram. Appointment scheduled for Friday, October 13, 2018 at 0840.        Pelvic/Bimanual No Pap smear completed today since last Pap smear was 08/31/2017. Pap smear not indicated per B[facility]CP guidelines.   Smoking History: Patient has never smoked.  Patient Navigation: Patient education provided. Access to services provided for patient through [facility] program. Spanish interpreter provided.   Breast and Cervical Cancer Risk Assessment: Patient has no family history of breast cancer, known genetic mutations, or radiation treatment to the chest before age 33. Patient has no history of cervical dysplasia, immunocompromised, or DES exposure in-utero.  Risk Assessment    Risk Scores      10/05/2018   Last edited by: Lynnell Dike, LPN   5-year risk: 0.2 %   Lifetime risk: 5.2 %         Used Spanish interpreter Ladell Pier from [employer]

## 2018-10-13 ENCOUNTER — Ambulatory Visit: Payer: Self-pay

## 2018-10-13 ENCOUNTER — Ambulatory Visit
Admission: RE | Admit: 2018-10-13 | Discharge: 2018-10-13 | Disposition: A | Discharge status: Home / Self Care | Payer: No Typology Code available for payment source | Source: Ambulatory Visit | Attending: Obstetrics and Gynecology | Admitting: Obstetrics and Gynecology

## 2018-10-13 ENCOUNTER — Other Ambulatory Visit: Payer: Self-pay

## 2018-10-13 DIAGNOSIS — N63 Unspecified lump in unspecified breast: Secondary | ICD-10-CM

## 2018-11-10 ENCOUNTER — Encounter (HOSPITAL_COMMUNITY): Payer: Self-pay | Admitting: *Deleted

## 2019-02-13 ENCOUNTER — Other Ambulatory Visit: Payer: Self-pay

## 2019-02-13 DIAGNOSIS — Z20822 Contact with and (suspected) exposure to covid-19: Secondary | ICD-10-CM

## 2019-02-14 LAB — NOVEL CORONAVIRUS, NAA: SARS-CoV-2, NAA: DETECTED — AB

## 2019-02-22 ENCOUNTER — Emergency Department (HOSPITAL_BASED_OUTPATIENT_CLINIC_OR_DEPARTMENT_OTHER)
Admission: EM | Admit: 2019-02-22 | Discharge: 2019-02-22 | Disposition: A | Discharge status: Home / Self Care | Payer: No Typology Code available for payment source | Attending: Emergency Medicine | Admitting: Emergency Medicine

## 2019-02-22 ENCOUNTER — Other Ambulatory Visit: Payer: Self-pay

## 2019-02-22 ENCOUNTER — Encounter (HOSPITAL_BASED_OUTPATIENT_CLINIC_OR_DEPARTMENT_OTHER): Payer: Self-pay | Admitting: *Deleted

## 2019-02-22 DIAGNOSIS — R5383 Other fatigue: Secondary | ICD-10-CM | POA: Insufficient documentation

## 2019-02-22 LAB — CBC WITH DIFFERENTIAL/PLATELET
Abs Immature Granulocytes: 0.02 10*3/uL (ref 0.00–0.07)
Basophils Absolute: 0 10*3/uL (ref 0.0–0.1)
Basophils Relative: 0 %
Eosinophils Absolute: 0.2 10*3/uL (ref 0.0–0.5)
Eosinophils Relative: 2 %
HCT: 38.2 % (ref 36.0–46.0)
Hemoglobin: 12.4 g/dL (ref 12.0–15.0)
Immature Granulocytes: 0 %
Lymphocytes Relative: 21 %
Lymphs Abs: 2 10*3/uL (ref 0.7–4.0)
MCH: 27.5 pg (ref 26.0–34.0)
MCHC: 32.5 g/dL (ref 30.0–36.0)
MCV: 84.7 fL (ref 80.0–100.0)
Monocytes Absolute: 0.8 10*3/uL (ref 0.1–1.0)
Monocytes Relative: 9 %
Neutro Abs: 6.5 10*3/uL (ref 1.7–7.7)
Neutrophils Relative %: 68 %
Platelets: 294 10*3/uL (ref 150–400)
RBC: 4.51 MIL/uL (ref 3.87–5.11)
RDW: 13.3 % (ref 11.5–15.5)
WBC: 9.5 10*3/uL (ref 4.0–10.5)
nRBC: 0 % (ref 0.0–0.2)

## 2019-02-22 LAB — PREGNANCY, URINE: Preg Test, Ur: NEGATIVE

## 2019-02-22 LAB — BASIC METABOLIC PANEL
Anion gap: 10 (ref 5–15)
BUN: 9 mg/dL (ref 6–20)
CO2: 24 mmol/L (ref 22–32)
Calcium: 8.9 mg/dL (ref 8.9–10.3)
Chloride: 103 mmol/L (ref 98–111)
Creatinine, Ser: 0.47 mg/dL (ref 0.44–1.00)
GFR calc Af Amer: >60 mL/min (ref >60)
GFR calc non Af Amer: >60 mL/min (ref >60)
Glucose, Bld: 104 mg/dL — ABNORMAL HIGH (ref 70–99)
Potassium: 3.8 mmol/L (ref 3.5–5.1)
Sodium: 137 mmol/L (ref 135–145)

## 2019-02-22 LAB — URINALYSIS, ROUTINE W REFLEX MICROSCOPIC
Bilirubin Urine: NEGATIVE
Glucose, UA: NEGATIVE mg/dL
Hgb urine dipstick: NEGATIVE
Ketones, ur: NEGATIVE mg/dL
Leukocytes,Ua: NEGATIVE
Nitrite: NEGATIVE
Protein, ur: NEGATIVE mg/dL
Specific Gravity, Urine: 1.015 (ref 1.005–1.030)
pH: 7 (ref 5.0–8.0)

## 2019-02-22 MED ORDER — SODIUM CHLORIDE 0.9 % IV BOLUS
1000.0000 mL | Freq: Once | INTRAVENOUS | Status: AC
  Administered 2019-02-22: 1000 mL via INTRAVENOUS

## 2019-02-22 NOTE — ED Provider Notes (Signed)
[facility] Provider Note   CSN: [id number] Arrival date & time: 02/22/19  1555     History   Chief Complaint Chief Complaint  Patient presents with  . Fatigue    HPI Ana Goodwin is a 38 y.o. female.  She was diagnosed with Covid about 10 days ago and has had symptoms probably a week before that.  She said her fevers have resolved and she never really had much shortness of breath or cough.  She is complaining of 3 days of feeling some chills and some dizziness.  No fever no cough no nausea vomiting diarrhea or urinary symptoms.  She said she has been eating and drinking well.     The history is provided by the patient.  Dizziness Quality:  Lightheadedness Severity:  Moderate Onset quality:  Gradual Timing:  Intermittent Progression:  Unchanged Chronicity:  New Context: physical activity   Relieved by:  None tried Worsened by:  Standing up Ineffective treatments:  None tried Associated symptoms: vision changes (blurry at times)   Associated symptoms: no blood in stool, no chest pain, no diarrhea, no headaches, no hearing loss, no nausea, no palpitations, no shortness of breath, no syncope, no tinnitus, no vomiting and no weakness     Past Medical History:  Diagnosis Date  . 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 Headache(784.0)   . Morbid obesity ([place])   . Neuromuscular disorder ([place])   . Pain in lower back    2 yrs ago/seen at hospital/lasted one months to be able to stand alone and walk  . Ulcer of abdomen wall [facility])     Patient Active Problem List   Diagnosis Date Noted  . Morbid obesity ([place])   . Obesity 06/05/2013  . Language barrier (Spanish only, needs interpreter) 02/21/2013  . Supervision of other normal pregnancy 10/30/2012  . Previous cesarean delivery, antepartum condition or complication 70/35/0093    Past Surgical History:  Procedure Laterality Date  . CESAREAN SECTION     X 2  . CESAREAN  SECTION N/A 05/25/2013   Procedure: REPEAT CESAREAN SECTION LOW TRANSVERSE WITH VERTICAL ABDOMINAL INCISION;  Surgeon: Jonnie Kind, MD;  Location: [facility];  Service: Obstetrics;  Laterality: N/A;     OB History    Gravida  3   Para  3   Term  3   Preterm  0   AB  0   Living  3     SAB  0   TAB  0   Ectopic  0   Multiple  0   Live Births  3            Home Medications    Prior to Admission medications   Medication Sig Start Date End Date Taking? Authorizing Provider  acetaminophen (TYLENOL) 500 MG tablet Take 500 mg by mouth every 6 (six) hours as needed for mild pain or moderate pain.    [provider]  benzonatate (TESSALON) 100 MG capsule Take 1 capsule (100 mg total) by mouth every 8 (eight) hours. Patient not taking: Reported on 10/05/2018 06/18/18   Frederica Kuster, PA-C  cetirizine-pseudoephedrine (ZYRTEC-D) 5-120 MG tablet Take 1 tablet by mouth daily. Patient not taking: Reported on 10/05/2018 06/18/18   Frederica Kuster, PA-C    Family History Family History  Problem Relation Age of Onset  . Diabetes Mother   . Cancer Father  unsure of type    Social History Social History   Tobacco Use  . Smoking status: Never Smoker  . Smokeless tobacco: Never Used  Substance Use Topics  . Alcohol use: No  . Drug use: No     Allergies   Patient has no known allergies.   Review of Systems Review of Systems  Constitutional: Positive for fatigue. Negative for fever.  HENT: Negative for hearing loss, sore throat and tinnitus.   Eyes: Positive for visual disturbance. Negative for pain.  Respiratory: Negative for shortness of breath.   Cardiovascular: Negative for chest pain, palpitations and syncope.  Gastrointestinal: Negative for abdominal pain, blood in stool, diarrhea, nausea and vomiting.  Genitourinary: Negative for dysuria.  Musculoskeletal: Negative for neck pain.  Skin: Negative for rash.  Neurological: Positive for  dizziness. Negative for weakness and headaches.     Physical Exam Updated Vital Signs BP 122/79 (BP Location: Left Arm)   Pulse 89   Temp 99.2 F (37.3 C) (Oral)   Resp 14   Ht 4\' 6"  (1.372 m)   Wt 89.4 kg   SpO2 99%   BMI 47.50 kg/m   Physical Exam Vitals signs and nursing note reviewed.  Constitutional:      General: She is not in acute distress.    Appearance: She is well-developed.  HENT:     Head: Normocephalic and atraumatic.  Eyes:     Extraocular Movements: Extraocular movements intact.     Conjunctiva/sclera: Conjunctivae normal.     Pupils: Pupils are equal, round, and reactive to light.  Neck:     Musculoskeletal: Neck supple.  Cardiovascular:     Rate and Rhythm: Normal rate and regular rhythm.     Heart sounds: No murmur.  Pulmonary:     Effort: Pulmonary effort is normal. No respiratory distress.     Breath sounds: Normal breath sounds.  Abdominal:     Palpations: Abdomen is soft.     Tenderness: There is no abdominal tenderness.  Musculoskeletal: Normal range of motion.     Right lower leg: No edema.     Left lower leg: No edema.  Skin:    General: Skin is warm and dry.     Capillary Refill: Capillary refill takes less than 2 seconds.  Neurological:     General: No focal deficit present.     Mental Status: She is alert and oriented to person, place, and time.     Cranial Nerves: No cranial nerve deficit.     Sensory: No sensory deficit.     Motor: No weakness.     Gait: Gait normal.      ED Treatments / Results  Labs (all labs ordered are listed, but only abnormal results are displayed) Labs Reviewed  BASIC METABOLIC PANEL - Abnormal; Notable for the following components:      Result Value   Glucose, Bld 104 (*)    All other components within normal limits  PREGNANCY, URINE  URINALYSIS, ROUTINE W REFLEX MICROSCOPIC  CBC WITH DIFFERENTIAL/PLATELET    EKG None  Radiology No results found.  Procedures Procedures (including  critical care time)  Medications Ordered in ED Medications  sodium chloride 0.9 % bolus 1,000 mL ( Intravenous Stopped 02/22/19 1840)     Initial Impression / Assessment and Plan / ED Course  I have reviewed the triage vital signs and the nursing notes.  Pertinent labs & imaging results that were available during my care of the patient were reviewed by  me and considered in my medical decision making (see chart for details).  Clinical Course as of Feb 21 1918  Thu Feb 22, 2019  [id number] 38 year old female with complaint of chills and dizziness fatigue over the past 3 days with some intermittent blurry vision.  She was diagnosed with Covid 10 days ago although she feels most of her Covid symptoms are resolved.  Differential includes dehydration, infection, post Covid symptoms, metabolic derangement.  Checking some labs and giving her some IV fluids.  She is satting 99% side think she needs a chest x-ray currently.   [MB]  1810 Nitrite: NEGATIVE [MB]  1825 Patient's lab work has been fairly unremarkable.  I reviewed all this with her and she feels better after some IV fluids.  I recommended that she follow-up with her doctor and return if any worsening symptoms.   [MB]    Clinical Course User Index [MB] Terrilee FilesButler, Michael C, MD       Final Clinical Impressions(s) / ED Diagnoses   Final diagnoses:  Fatigue, unspecified type    ED Discharge Orders    None       Terrilee FilesButler, Michael C, MD 02/22/19 1919

## 2019-02-22 NOTE — Discharge Instructions (Addendum)
You were seen in the emergency department for feeling more fatigued and dizzy with some chills.  You had blood work and a urine test that did not show any serious findings.  This may be related to the healing process after a Covid infection.  Please stay well-hydrated.  Follow-up with your doctor or return to the emergency department if any worsening symptoms.

## 2019-02-22 NOTE — ED Triage Notes (Signed)
Pt c/o weakness and fatigue x 2 weeks. DX covid 18 x 10 days ago

## 2020-09-06 IMAGING — MG DIGITAL DIAGNOSTIC BILATERAL MAMMOGRAM WITH TOMO AND CAD
8 series · 8 of 24 positions shown · non-contrast
Comparison: Previous exam(s).

CLINICAL DATA: Patient presents for pain within the 4-5 o'clock
position left breast. Patient states the pain has resolved recently
with changing of her bra.

EXAM:
DIGITAL DIAGNOSTIC BILATERAL MAMMOGRAM WITH CAD AND TOMO
ULTRASOUND LEFT BREAST

[L MLO synth-2D]
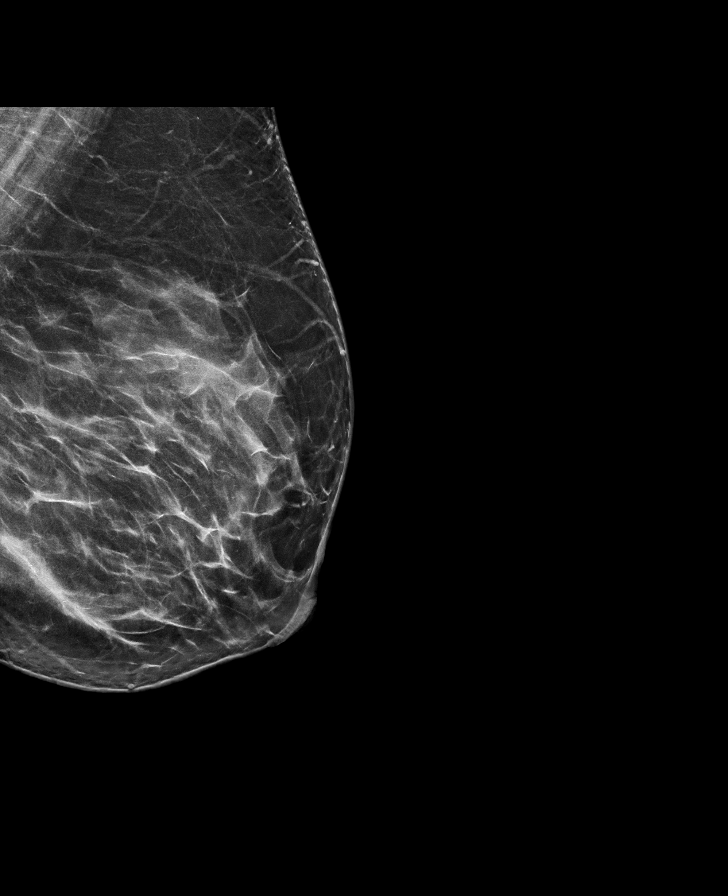

[L CC synth-2D]
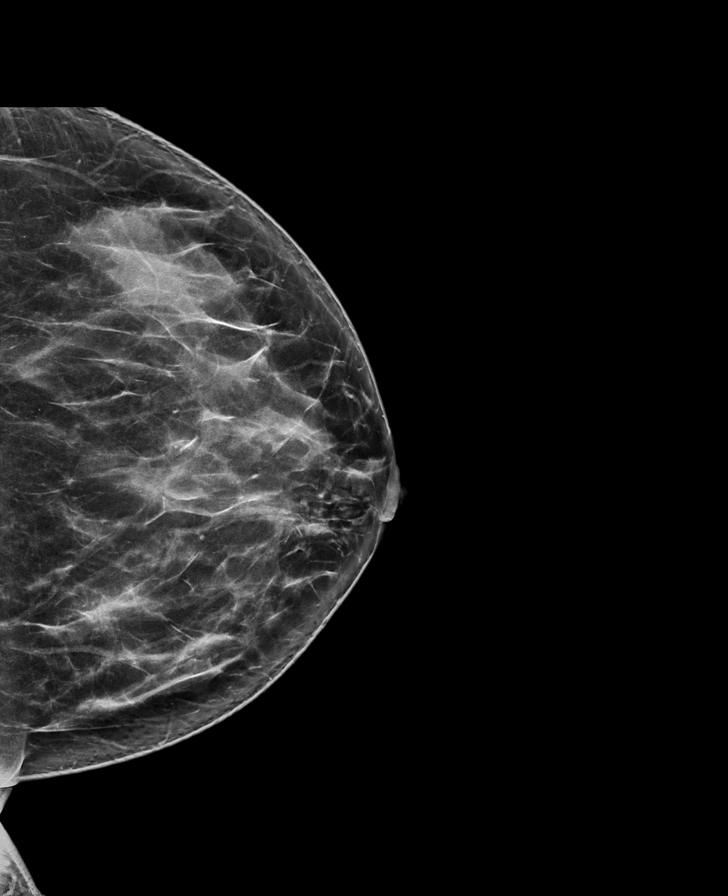

[R MLO synth-2D]
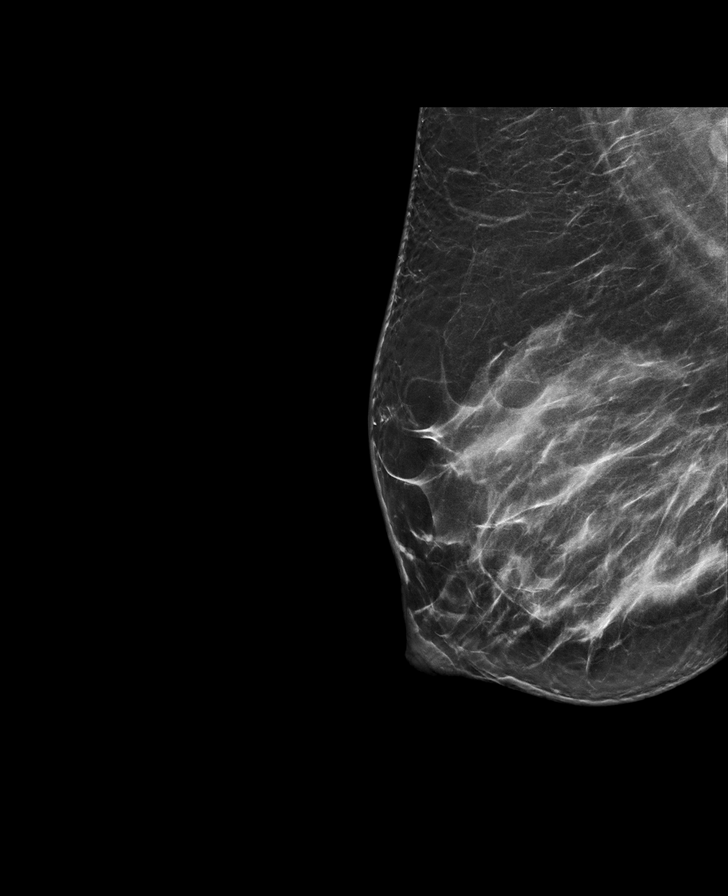

[R CC synth-2D]
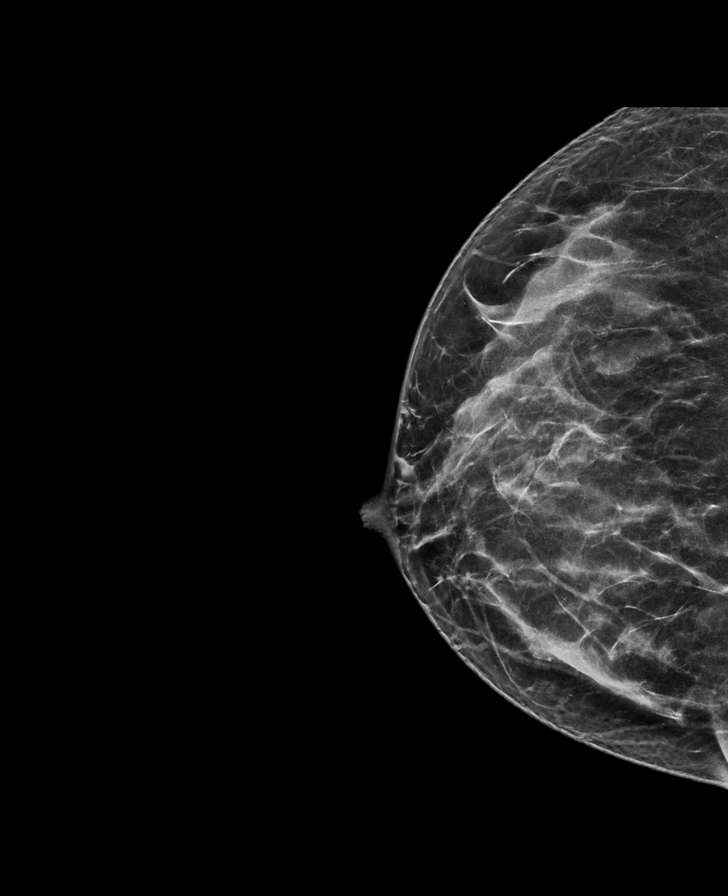

[L MLO tomo · tomo slice 39/76.0]
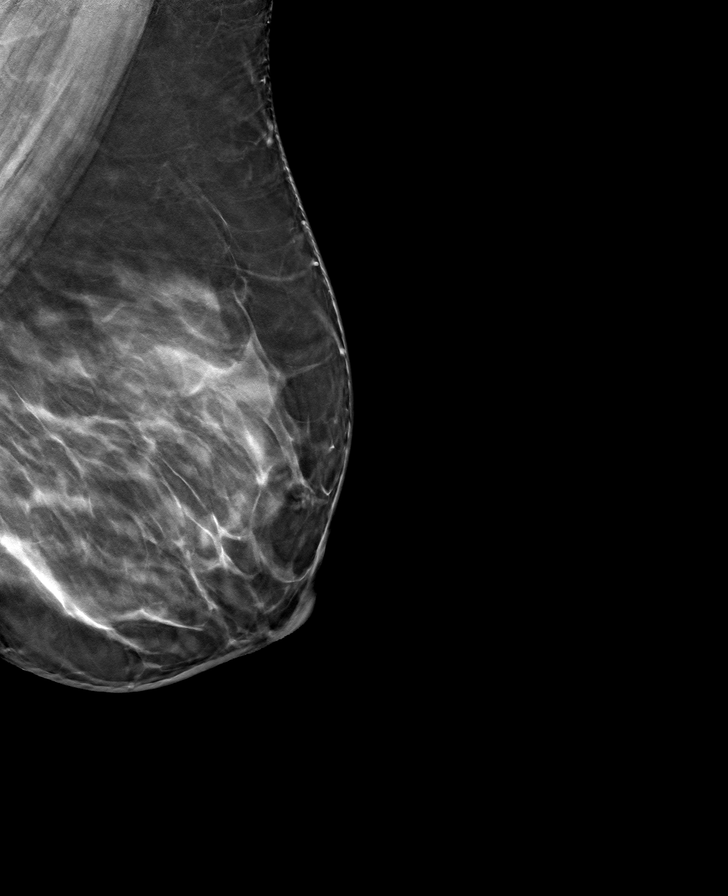

[R MLO tomo · tomo slice 40/79.0]
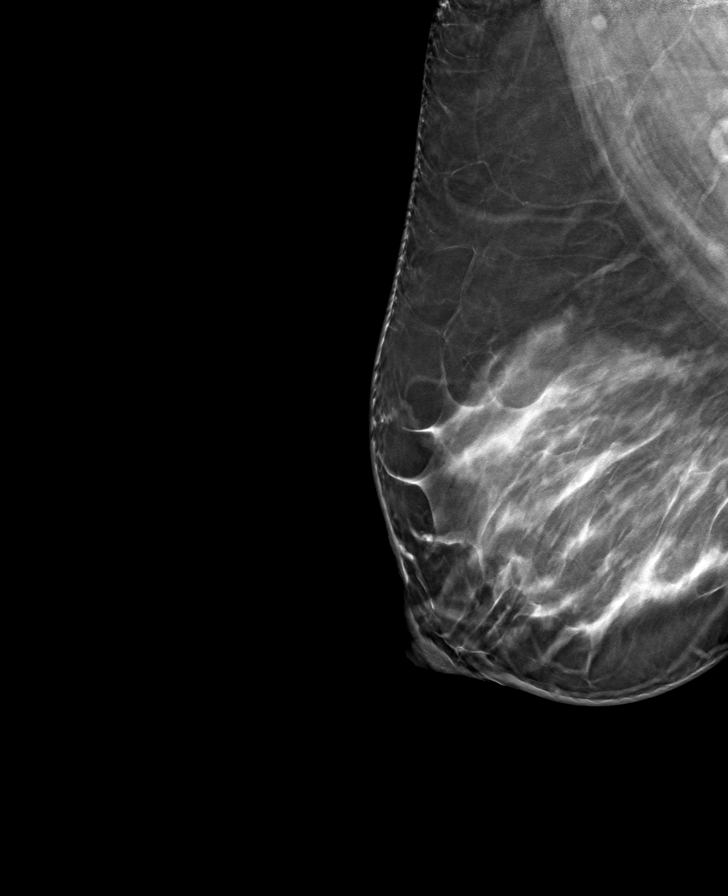

[L CC tomo · tomo slice 38/75.0]
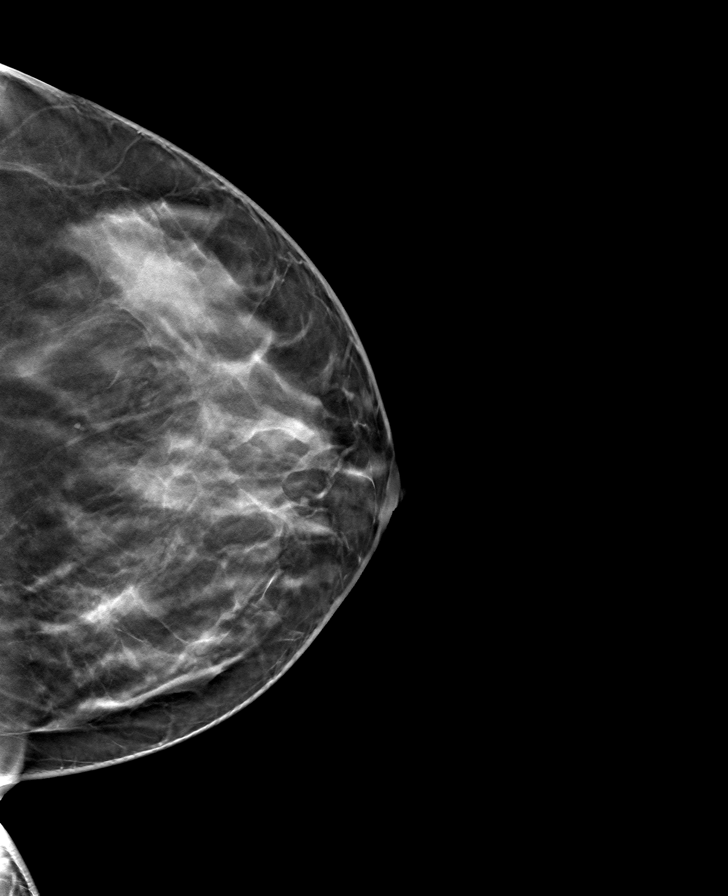

[R CC tomo · tomo slice 35/68.0]
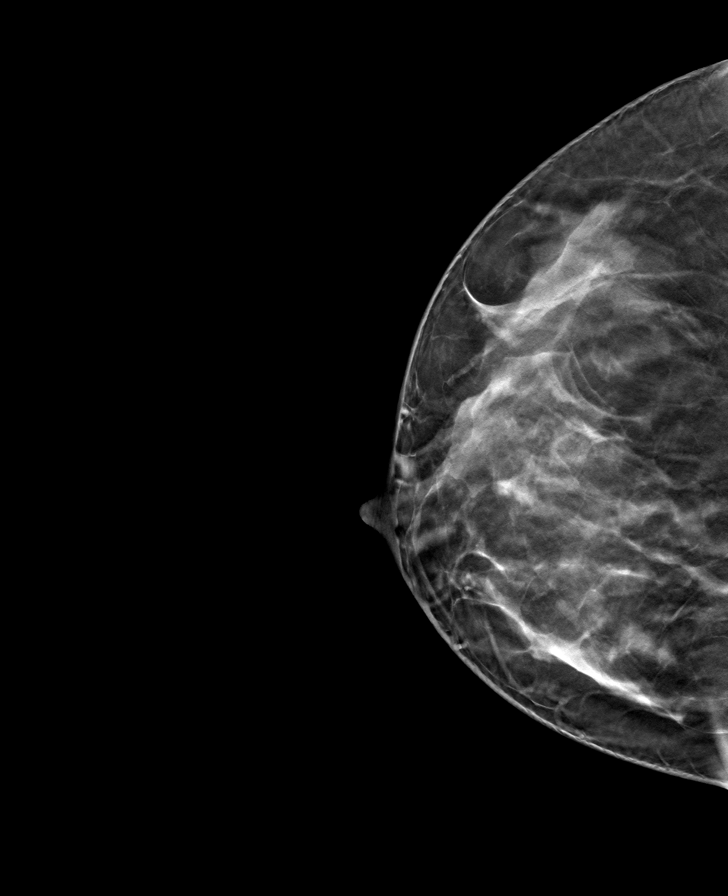

[8 of 24 positions shown; findings below may reference images not displayed]

ACR Breast Density Category c: The breast tissue is heterogeneously
dense, which may obscure small masses.
FINDINGS: No concerning masses, calcifications or distortion identified within
either breast.

Mammographic images were processed with CAD.

Targeted ultrasound is performed, showing normal tissue without
suspicious mass left breast 4 and 5 o'clock position at the site of
focal tenderness.
IMPRESSION: No mammographic evidence for malignancy.

RECOMMENDATION:
Continued clinical evaluation for left breast focal tenderness.

Screening mammogram at age 42 unless there are persistent or
intervening clinical concerns. (Code:[id number]-[id number])

I have discussed the findings and recommendations with the patient.
Results were also provided in writing at the conclusion of the
visit. If applicable, a reminder letter will be sent to the patient
regarding the next appointment.

BI-RADS CATEGORY  1: Negative.
